# Patient Record
Sex: Female | Born: 1985 | Race: White | Hispanic: No | Marital: Single | Smoking: Never smoker
Health system: Southern US, Community
[De-identification: ages and names within clinical notes are randomized; demographics above are authoritative.]

## PROBLEM LIST (undated history)

## (undated) ENCOUNTER — Inpatient Hospital Stay (HOSPITAL_COMMUNITY): Payer: Self-pay

## (undated) DIAGNOSIS — J45909 Unspecified asthma, uncomplicated: Secondary | ICD-10-CM

## (undated) DIAGNOSIS — F909 Attention-deficit hyperactivity disorder, unspecified type: Secondary | ICD-10-CM

## (undated) DIAGNOSIS — O24419 Gestational diabetes mellitus in pregnancy, unspecified control: Secondary | ICD-10-CM

## (undated) DIAGNOSIS — F32A Depression, unspecified: Secondary | ICD-10-CM

## (undated) DIAGNOSIS — F419 Anxiety disorder, unspecified: Secondary | ICD-10-CM

## (undated) DIAGNOSIS — G43909 Migraine, unspecified, not intractable, without status migrainosus: Secondary | ICD-10-CM

## (undated) HISTORY — PX: AXILLARY LYMPH NODE BIOPSY: SHX5737

## (undated) HISTORY — DX: Anxiety disorder, unspecified: F41.9

## (undated) HISTORY — PX: KNEE SURGERY: SHX244

## (undated) HISTORY — PX: TONSILLECTOMY: SUR1361

---

## 2014-03-19 DIAGNOSIS — L709 Acne, unspecified: Secondary | ICD-10-CM | POA: Insufficient documentation

## 2014-03-19 DIAGNOSIS — L700 Acne vulgaris: Secondary | ICD-10-CM | POA: Insufficient documentation

## 2014-06-17 DIAGNOSIS — F32A Depression, unspecified: Secondary | ICD-10-CM | POA: Insufficient documentation

## 2014-06-17 DIAGNOSIS — F329 Major depressive disorder, single episode, unspecified: Secondary | ICD-10-CM | POA: Insufficient documentation

## 2014-08-10 ENCOUNTER — Ambulatory Visit: Payer: Self-pay | Admitting: Physician Assistant

## 2014-09-14 ENCOUNTER — Emergency Department: Payer: Self-pay | Admitting: Emergency Medicine

## 2014-10-06 DIAGNOSIS — R42 Dizziness and giddiness: Secondary | ICD-10-CM | POA: Insufficient documentation

## 2014-10-06 DIAGNOSIS — Z87898 Personal history of other specified conditions: Secondary | ICD-10-CM | POA: Insufficient documentation

## 2014-12-08 ENCOUNTER — Ambulatory Visit: Payer: Self-pay | Admitting: Emergency Medicine

## 2015-02-03 ENCOUNTER — Ambulatory Visit: Payer: Self-pay | Admitting: Family Medicine

## 2015-03-03 DIAGNOSIS — G43909 Migraine, unspecified, not intractable, without status migrainosus: Secondary | ICD-10-CM | POA: Insufficient documentation

## 2015-03-03 DIAGNOSIS — F909 Attention-deficit hyperactivity disorder, unspecified type: Secondary | ICD-10-CM | POA: Insufficient documentation

## 2015-04-07 DIAGNOSIS — J309 Allergic rhinitis, unspecified: Secondary | ICD-10-CM | POA: Insufficient documentation

## 2015-12-07 ENCOUNTER — Ambulatory Visit
Admission: RE | Admit: 2015-12-07 | Discharge: 2015-12-07 | Disposition: A | Discharge status: Home / Self Care | Payer: No Typology Code available for payment source | Source: Ambulatory Visit | Attending: Obstetrics and Gynecology | Admitting: Obstetrics and Gynecology

## 2015-12-07 ENCOUNTER — Ambulatory Visit: Payer: Self-pay

## 2015-12-07 ENCOUNTER — Other Ambulatory Visit: Payer: Self-pay | Admitting: Obstetrics and Gynecology

## 2015-12-07 DIAGNOSIS — O209 Hemorrhage in early pregnancy, unspecified: Secondary | ICD-10-CM

## 2015-12-07 DIAGNOSIS — O4691 Antepartum hemorrhage, unspecified, first trimester: Secondary | ICD-10-CM | POA: Diagnosis not present

## 2016-04-21 ENCOUNTER — Encounter: Payer: Self-pay | Admitting: *Deleted

## 2016-04-21 ENCOUNTER — Ambulatory Visit
Admission: EM | Admit: 2016-04-21 | Discharge: 2016-04-21 | Disposition: A | Discharge status: Home / Self Care | Payer: Worker's Compensation | Attending: Family Medicine | Admitting: Family Medicine

## 2016-04-21 DIAGNOSIS — W5501XA Bitten by cat, initial encounter: Secondary | ICD-10-CM | POA: Diagnosis not present

## 2016-04-21 DIAGNOSIS — S61052A Open bite of left thumb without damage to nail, initial encounter: Secondary | ICD-10-CM

## 2016-04-21 HISTORY — DX: Migraine, unspecified, not intractable, without status migrainosus: G43.909

## 2016-04-21 MED ORDER — AMOXICILLIN-POT CLAVULANATE 875-125 MG PO TABS: 1.0000 | ORAL_TABLET | Freq: Two times a day (BID) | ORAL | Status: DC

## 2016-04-21 MED ORDER — MUPIROCIN 2 % EX OINT: 1.0000 "application " | TOPICAL_OINTMENT | Freq: Three times a day (TID) | CUTANEOUS | Status: DC

## 2016-04-21 NOTE — ED Notes (Signed)
Patient was at work today when she was bit by a cat. The patient was giving the cat a pill when the cat bit her finger. The cat is known and does have it's rabies vaccination.

## 2016-04-21 NOTE — ED Provider Notes (Signed)
CSN: [id number]     Arrival date & time 04/21/16  1401 History   First MD Initiated Contact with Patient 04/21/16 1510   Nurses notes were reviewed. Chief Complaint  Patient presents with  . Animal Bite   Patient reports while working at her job she was trying to put a pill in the cat's mouth and the cat each with that and nicked her on her thumb. Since that she's has some pain and throbbing.  Patient does not have any drug allergies history of migraines. She's had tonsillectomy. She never smoked and is no significant past family medical history as this problem. She does report though several years ago having a with a feels dog bite right hand it went undiagnosed and almost basically sounds systemic reaction and she wound up on prednisone the swelling and other vague myalgias and pain. She does state though that she she was finally better after that and she didn't want having a lymph node biopsy and they think that it was from a dog bite.    (Consider location/radiation/quality/duration/timing/severity/associated sxs/prior Treatment) Patient is a 30 y.o. female presenting with animal bite. The history is provided by the patient. No language interpreter was used.  Animal Bite Contact animal:  Cat Location:  Finger Finger injury location:  L thumb Time since incident:  6 hours Pain details:    Quality:  Aching, sharp and stinging   Timing:  Constant   Progression:  Unchanged Incident location:  Work Provoked: provoked   Notifications:  Animal control Animal's rabies vaccination status:  Up to date Tetanus status:  Up to date Worsened by:  Nothing tried Ineffective treatments:  None tried Associated symptoms: no rash and no swelling     Past Medical History  Diagnosis Date  . Migraines    Past Surgical History  Procedure Laterality Date  . Tonsillectomy    . Knee surgery Right     repaired   History reviewed. No pertinent family history. Social History  Substance Use Topics   . Smoking status: Never Smoker   . Smokeless tobacco: Never Used  . Alcohol Use: Yes   OB History    No data available     Review of Systems  Skin: Negative for rash.  All other systems reviewed and are negative.   Allergies  Hydrocodone  Home Medications   Prior to Admission medications   Medication Sig Start Date End Date Taking? Authorizing Provider  hydrOXYzine (VISTARIL) 25 MG capsule Take 25 mg by mouth 3 (three) times daily as needed.   Yes Historical Provider, MD  venlafaxine (EFFEXOR) 75 MG tablet Take 75 mg by mouth 2 (two) times daily.   Yes Historical Provider, MD  amoxicillin-clavulanate (AUGMENTIN) 875-125 MG tablet Take 1 tablet by mouth 2 (two) times daily. 04/21/16   Hassan RowanEugene Natesha Hassey, MD  mupirocin ointment (BACTROBAN) 2 % Apply 1 application topically 3 (three) times daily. 04/21/16   Hassan RowanEugene Emmett Arntz, MD   Meds Ordered and Administered this Visit  Medications - No data to display  BP 127/79 mmHg  Pulse 89  Temp(Src) 89 F (31.7 C) (Oral)  Resp 87  Ht 5\' 3"  (1.6 m)  Wt 155 lb (70.308 kg)  BMI 27.46 kg/m2  SpO2 100%  LMP 04/07/2016 No data found.   Physical Exam  Constitutional: She is oriented to person, place, and time. She appears well-developed and well-nourished.  HENT:  Head: Normocephalic and atraumatic.  Eyes: Pupils are equal, round, and reactive to light.  Musculoskeletal: Normal  range of motion. She exhibits tenderness.       Left hand: She exhibits tenderness. Normal strength noted.       Hands: Puncture bite on the L thumb  Neurological: She is alert and oriented to person, place, and time.  Skin: Skin is warm and dry.  Psychiatric: She has a normal mood and affect.  Vitals reviewed.   ED Course  Procedures (including critical care time)  Labs Review Labs Reviewed - No data to display  Imaging Review No results found.   Visual Acuity Review  Right Eye Distance:   Left Eye Distance:   Bilateral Distance:    Right Eye Near:     Left Eye Near:    Bilateral Near:         MDM   1. Cat bite of thumb, left, initial encounter    Patient will be allowed to go back to work tomorrow. Recommend she bandaged and dressed the wound it is from the exposed to dirt." Control was notified by the staff. Sprain to her this is one the rare workers comp visits this required a follow-up unless she starts developing problems and that she should come back. Otherwise take the Augmentin tablets twice day for 7 days and Bactroban for 10 days. Workers comp form filled out as well.  Note: This dictation was prepared with Dragon dictation along with smaller phrase technology. Any transcriptional errors that result from this process are unintentional.    Hassan Rowan, MD 04/21/16 2024

## 2016-04-21 NOTE — Discharge Instructions (Signed)
Animal Bite °Animal bite wounds can get infected. It is important to get proper medical treatment. Ask your doctor if you need rabies treatment. °HOME CARE  °Wound Care °· Follow instructions from your doctor about how to take care of your wound. Make sure you: °¨ Wash your hands with soap and water before you change your bandage (dressing). If you cannot use soap and water, use hand sanitizer. °¨ Change your bandage as told by your doctor. °¨ Leave stitches (sutures), skin glue, or skin tape (adhesive) strips in place. They may need to stay in place for 2 weeks or longer. If tape strips get loose and curl up, you may trim the loose edges. Do not remove tape strips completely unless your doctor says it is okay. °· Check your wound every day for signs of infection. Watch for:   °¨   Redness, swelling, or pain that gets worse.   °¨   Fluid, blood, or pus.   °General Instructions °· Take or apply over-the-counter and prescription medicines only as told by your doctor.   °· If you were prescribed an antibiotic, take or apply it as told by your doctor. Do not stop using the antibiotic even if your condition improves.   °· Keep the injured area raised (elevated) above the level of your heart while you are sitting or lying down. °· If directed, apply ice to the injured area.   °¨   Put ice in a plastic bag.   °¨   Place a towel between your skin and the bag.   °¨   Leave the ice on for 20 minutes, 2-3 times per day.   °· Keep all follow-up visits as told by your doctor. This is important.   °GET HELP IF: °· You have redness, swelling, or pain that gets worse.   °· You have a general feeling of sickness (malaise).   °· You feel sick to your stomach (nauseous). °· You throw up (vomit).   °· You have pain that does not get better.   °GET HELP RIGHT AWAY IF:  °· You have a red streak going away from your wound.   °· You have fluid, blood, or pus coming from your wound.   °· You have a fever or chills.   °· You have trouble  moving your injured area.   °· You have numbness or tingling anywhere on your body.   °  °This information is not intended to replace advice given to you by your health care provider. Make sure you discuss any questions you have with your health care provider. °  °Document Released: 11/27/2005 Document Revised: 08/18/2015 Document Reviewed: 04/14/2015 °Elsevier Interactive Patient Education ©2016 [employer] ° °

## 2016-04-21 NOTE — ED Notes (Signed)
Officer Darcella Gasmanodson from [employer] came to [facility].

## 2016-07-18 ENCOUNTER — Telehealth: Payer: Self-pay | Admitting: Obstetrics and Gynecology

## 2016-07-18 NOTE — Telephone Encounter (Signed)
PT CALLED AND LEFT A MESSAGE ON THE OFFICE VM STATING THAT HER PALMS AND FEET ARE ITCHING REALLY BAD TO A POINT THAT SHE CAN NOT SLEEP AT NIGHT, SHE STATED SHE THINKS SHE HAS COLISTASIS AND WOULD LIKE TO COME IN AND GET BLOOD WORK DONE TO CHECK HER LIVER FUNCTION AND BOWELS. SHE STATED YOU CAN ALOS CALL HER BACK AT WORK SINCE HER CELL PHONE DOES NOT HAVE GOOD RECEPTION AT [phone], PT DOES WORK AT A [employer].

## 2016-07-18 NOTE — Telephone Encounter (Signed)
Called pt she states that she is having severe itching on her hands and feet (palms and soles) pt states that she has tried ice packs and benadryl for relief but has been unsuccessful. Denies exposure to new irritants.  Advised pt of the need to be seen. Pt scheduled for 07/20/16 @4 :30pm.

## 2016-07-20 ENCOUNTER — Encounter: Payer: Self-pay | Admitting: Obstetrics and Gynecology

## 2016-07-20 ENCOUNTER — Ambulatory Visit (INDEPENDENT_AMBULATORY_CARE_PROVIDER_SITE_OTHER): Payer: Managed Care, Other (non HMO) | Admitting: Obstetrics and Gynecology

## 2016-07-20 VITALS — BP 133/78 | HR 98 | Wt 169.4 lb

## 2016-07-20 DIAGNOSIS — Z3490 Encounter for supervision of normal pregnancy, unspecified, unspecified trimester: Secondary | ICD-10-CM

## 2016-07-20 DIAGNOSIS — L299 Pruritus, unspecified: Secondary | ICD-10-CM | POA: Diagnosis not present

## 2016-07-20 DIAGNOSIS — Z3401 Encounter for supervision of normal first pregnancy, first trimester: Secondary | ICD-10-CM | POA: Diagnosis not present

## 2016-07-21 NOTE — Progress Notes (Signed)
    GYNECOLOGY CLINIC PROGRESS NOTE  Subjective:    Patient ID: Brooke [name], female    DOB: 05/05/1986, 30 y.o.   MRN: [id number]  HPI  Brooke [name] is a 30 y.o. G1P0 female at 1462w2d ([facility] who presents for complaints of itching palms feet, and legs.  This has been ongoing for approximately 3-4 days.  States that she has been researching on the Internet, and was concerned about cholestasis of pregnancy. Wanted to have lab work done today.  Itching is not associated with a rash.  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  She also notes that she has recently discontinued her Effexor last week (was supposed to be transitioning from Effexor to Zoloft due to inadequate response to anxiety symptoms, however patient decided to stop medication cold [place] upon discovery of pregnancy).  The following portions of the patient's history were reviewed and updated as appropriate:   She  has a past medical history of Migraines.   She  has a past surgical history that includes Tonsillectomy and Knee surgery (Right).   Her family history is not on file.   She  reports that she has never smoked. She has never used smokeless tobacco. She reports that she drinks alcohol. She reports that she does not use drugs.   She has a current medication list which includes the following prescription(s): milk thistle and multivitamin-prenatal. No current outpatient prescriptions on file prior to visit.   No current facility-administered medications on file prior to visit.    She is allergic to hydrocodone..  Review of Systems Pertinent items noted in HPI and remainder of comprehensive ROS otherwise negative.   Objective:   Blood pressure 133/78, pulse 98, weight 169 lb 6.4 oz (76.8 kg), last menstrual period 04/07/2016. General appearance: alert and no distress, appears anxious Skin: Skin color, texture,  turgor normal. No rashes or lesions  Assessment:   Generalized itching Pregnancy  Plan:   - Discussion had with patient regarding origin of itching. Reassured patient that cholestasis of pregnancy was not likely due to early gestation. Also does not appear to be related to any particular pregnancy associated rashes like PUPPS. On further discussion with patient, it appears that her itching does increase during stressful situations. Patient likely experiencing anxiety induced itching, especially since she has discontinued her anxiety medications upon discovery of pregnancy.  Patient notes that she does not desire to resume antidepressant medications for treatment of anxiety symptoms. We'll prescribe Atarax for itching symptoms, patient notes that she was previously on this as well for treatment of her anxiety, however discontinued due to receiving mixed messages as to whether she could continue the medication pregnancy. Advised the patient can continue, will resume taking 25 mg daily. - Patient with early gestation, will be seen next week for her new OB intake. She will be tested for toxoplasmosis along with other routine OB labs at that time. Can also assess her itching response to the Atarax at that visit.   A total of 30 minutes were spent face-to-face with the patient during the encounter with greater than 50% dealing with counseling and coordination of care.   Hildred LaserAnika Delisia Mcquiston, MD [facility]

## 2016-07-26 ENCOUNTER — Ambulatory Visit (INDEPENDENT_AMBULATORY_CARE_PROVIDER_SITE_OTHER): Payer: Managed Care, Other (non HMO) | Admitting: Obstetrics and Gynecology

## 2016-07-26 ENCOUNTER — Ambulatory Visit (INDEPENDENT_AMBULATORY_CARE_PROVIDER_SITE_OTHER): Payer: Managed Care, Other (non HMO)

## 2016-07-26 VITALS — BP 127/92 | HR 83 | Wt 170.6 lb

## 2016-07-26 DIAGNOSIS — T7589XA Other specified effects of external causes, initial encounter: Secondary | ICD-10-CM

## 2016-07-26 DIAGNOSIS — Z331 Pregnant state, incidental: Secondary | ICD-10-CM | POA: Diagnosis not present

## 2016-07-26 DIAGNOSIS — Z3490 Encounter for supervision of normal pregnancy, unspecified, unspecified trimester: Secondary | ICD-10-CM

## 2016-07-26 DIAGNOSIS — Z113 Encounter for screening for infections with a predominantly sexual mode of transmission: Secondary | ICD-10-CM

## 2016-07-26 DIAGNOSIS — Z36 Encounter for antenatal screening of mother: Secondary | ICD-10-CM

## 2016-07-26 DIAGNOSIS — Z8742 Personal history of other diseases of the female genital tract: Secondary | ICD-10-CM

## 2016-07-26 DIAGNOSIS — F909 Attention-deficit hyperactivity disorder, unspecified type: Secondary | ICD-10-CM | POA: Insufficient documentation

## 2016-07-26 DIAGNOSIS — Z3481 Encounter for supervision of other normal pregnancy, first trimester: Secondary | ICD-10-CM

## 2016-07-26 DIAGNOSIS — Z369 Encounter for antenatal screening, unspecified: Secondary | ICD-10-CM

## 2016-07-26 DIAGNOSIS — Z1389 Encounter for screening for other disorder: Secondary | ICD-10-CM

## 2016-07-26 DIAGNOSIS — Z8759 Personal history of other complications of pregnancy, childbirth and the puerperium: Secondary | ICD-10-CM

## 2016-07-26 DIAGNOSIS — F419 Anxiety disorder, unspecified: Secondary | ICD-10-CM | POA: Insufficient documentation

## 2016-07-26 NOTE — Progress Notes (Signed)
Brooke [name] presents for NOB nurse interview visit. G-2.  P-0010. [facility] Pregnancy education material explained and given. 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 NOB labs ordered.  HIV labs and Drug screen were explained optional and she could opt out of tests but did not decline. Drug screen ordered. PNV encouraged. NT to discuss with provider. Pt. To follow up with provider after accurate determination of dates per ultrasound for NOB physical.  Currently will schedule 3 wks out. Also informed that husband side of family has twins. Recently had CBC, and ABO, RH and ATB screen done. All questions answered. Ultrasound showed EGA 5.6 wks. And will need a repeat in 2 weeks for ultrasound to check heart rate. EDD changed to 03/22/16. Will send pt a my chart message to contact office to schedule.

## 2016-07-26 NOTE — Patient Instructions (Signed)
Pregnancy and Zika Virus Disease Zika virus disease, or Zika, is an illness that can spread to people from mosquitoes that carry the virus. It may also spread from person to person through infected body fluids. Zika first occurred in [place], but recently it has spread to new areas. The virus occurs in tropical climates. The location of Zika continues to change. Most people who become infected with Zika virus do not develop serious illness. However, Zika may cause birth defects in an unborn baby whose mother is infected with the virus. It may also increase the risk of miscarriage. WHAT ARE THE SYMPTOMS OF ZIKA VIRUS DISEASE? In many cases, people who have been infected with Zika virus do not develop any symptoms. If symptoms appear, they usually start about a week after the person is infected. Symptoms are usually mild. They may include:  Fever.  Rash.  Red eyes.  Joint pain. HOW DOES ZIKA VIRUS DISEASE SPREAD? The main way that Zika virus spreads is through the bite of a certain type of mosquito. Unlike most types of mosquitos, which bite only at night, the type of mosquito that carries Zika virus bites both at night and during the day. Zika virus can also spread through sexual contact, through a blood transfusion, and from a mother to her baby before or during birth. Once you have had Zika virus disease, it is unlikely that you will get it again. CAN I PASS ZIKA TO MY BABY DURING PREGNANCY? Yes, Zika can pass from a mother to her baby before or during birth. WHAT PROBLEMS CAN ZIKA CAUSE FOR MY BABY? A woman who is infected with Zika virus while pregnant is at risk of having her baby born with a condition in which the brain or head is smaller than expected (microcephaly). Babies who have microcephaly can have developmental delays, seizures, hearing problems, and vision problems. Having Zika virus disease during pregnancy can also increase the risk of miscarriage. HOW CAN ZIKA VIRUS DISEASE BE  PREVENTED? There is no vaccine to prevent Zika. The best way to prevent the disease is to avoid infected mosquitoes and avoid exposure to body fluids that can spread the virus. Avoid any possible exposure to Zika by taking the following precautions. For women and their sex partners:  Avoid traveling to high-risk areas. The locations where Zika is being reported change often. To identify high-risk areas, check the CDC travel website: www.cdc.gov/zika/geo/index.html  If you or your sex partner must travel to a high-risk area, talk with a health care provider before and after traveling.  Take all precautions to avoid mosquito bites if you live in, or travel to, any of the high-risk areas. Insect repellents are safe to use during pregnancy.  Ask your health care provider when it is safe to have sexual contact. For women:  If you are pregnant or trying to become pregnant, avoid sexual contact with persons who may have been exposed to Zika virus, persons who have possible symptoms of Zika, or persons whose history you are unsure about. If you choose to have sexual contact with someone who may have been exposed to Zika virus, use condoms correctly during the entire duration of sexual activity, every time. Do not share sexual devices, as you may be exposed to body fluids.  Ask your health care provider about when it is safe to attempt pregnancy after a possible exposure to Zika virus. WHAT STEPS SHOULD I TAKE TO AVOID MOSQUITO BITES? Take these steps to avoid mosquito bites when you are   in a high-risk area:  Wear loose clothing that covers your arms and legs.  Limit your outdoor activities.  Do not open windows unless they have window screens.  Sleep under mosquito nets.  Use insect repellent. The best insect repellents have:  DEET, picaridin, oil of lemon eucalyptus (OLE), or [id number] in them.  Higher amounts of an active ingredient in them.  Remember that insect repellents are safe to use  during pregnancy.  Do not use OLE on children who are younger than 3 years of age. Do not use insect repellent on babies who are younger than 2 months of age.  Cover your child's stroller with mosquito netting. Make sure the netting fits snugly and that any loose netting does not cover your child's mouth or nose. Do not use a blanket as a mosquito-protection cover.  Do not apply insect repellent underneath clothing.  If you are using sunscreen, apply the sunscreen before applying the insect repellent.  Treat clothing with permethrin. Do not apply permethrin directly to your skin. Follow label directions for safe use.  Get rid of standing water, where mosquitoes may reproduce. Standing water is often found in items such as buckets, bowls, animal food dishes, and flowerpots. When you return from traveling to any high-risk area, continue taking actions to protect yourself against mosquito bites for 3 weeks, even if you show no signs of illness. This will prevent spreading Zika virus to uninfected mosquitoes. WHAT SHOULD I KNOW ABOUT THE SEXUAL TRANSMISSION OF ZIKA? People can spread Zika to their sexual partners during vaginal, anal, or oral sex, or by sharing sexual devices. Many people with Zika do not develop symptoms, so a person could spread the disease without knowing that they are infected. The greatest risk is to women who are pregnant or who may become pregnant. Zika virus can live longer in semen than it can live in blood. Couples can prevent sexual transmission of the virus by:  Using condoms correctly during the entire duration of sexual activity, every time. This includes vaginal, anal, and oral sex.  Not sharing sexual devices. Sharing increases your risk of being exposed to body fluid from another person.  Avoiding all sexual activity until your health care provider says it is safe. SHOULD I BE TESTED FOR ZIKA VIRUS? A sample of your blood can be tested for Zika virus. A pregnant  woman should be tested if she may have been exposed to the virus or if she has symptoms of Zika. She may also have additional tests done during her pregnancy, such ultrasound testing. Talk with your health care provider about which tests are recommended.   This information is not intended to replace advice given to you by your health care provider. Make sure you discuss any questions you have with your health care provider.   Document Released: 08/18/2015 Document Reviewed: 08/11/2015 Elsevier Interactive Patient Education 2016 [employer] Minor Illnesses and Medications in Pregnancy  Cold/Flu:  Sudafed for congestion- Robitussin (plain) for cough- Tylenol for discomfort.  Please follow the directions on the label.  Try not to take any more than needed.  OTC Saline nasal spray and air humidifier or cool-mist  Vaporizer to sooth nasal irritation and to loosen congestion.  It is also important to increase intake of non carbonated fluids, especially if you have a fever.  Constipation:  Colace-2 capsules at bedtime; Metamucil- follow directions on label; Senokot- 1 tablet at bedtime.  Any one of these medications can be used.  It is also   very important to increase fluids and fruits along with regular exercise.  If problem persists please call the office.  Diarrhea:  Kaopectate as directed on the label.  Eat a bland diet and increase fluids.  Avoid highly seasoned foods.  Headache:  Tylenol 1 or 2 tablets every 3-4 hours as needed  Indigestion:  Maalox, Mylanta, Tums or Rolaids- as directed on label.  Also try to eat small meals and avoid fatty, greasy or spicy foods.  Nausea with or without Vomiting:  Nausea in pregnancy is caused by increased levels of hormones in the body which influence the digestive system and cause irritation when stomach acids accumulate.  Symptoms usually subside after 1st trimester of pregnancy.  Try the following: 1. Keep saltines, graham crackers or dry toast by your bed  to eat upon awakening. 2. Don't let your stomach get empty.  Try to eat 5-6 small meals per day instead of 3 large ones. 3. Avoid greasy fatty or highly seasoned foods.  4. Take OTC Unisom 1 tablet at bed time along with OTC Vitamin B6 25-50 mg 3 times per day.    If nausea continues with vomiting and you are unable to keep down food and fluids you may need a prescription medication.  Please notify your provider.   Sore throat:  Chloraseptic spray, throat lozenges and or plain Tylenol.  Vaginal Yeast Infection:  OTC Monistat for 7 days as directed on label.  If symptoms do not resolve within a week notify provider.  If any of the above problems do not subside with recommended treatment please call the office for further assistance.   Do not take Aspirin, Advil, Motrin or Ibuprofen.  * * OTC= Over the counter Hyperemesis Gravidarum Hyperemesis gravidarum is a severe form of nausea and vomiting that happens during pregnancy. Hyperemesis is worse than morning sickness. It may cause you to have nausea or vomiting all day for many days. It may keep you from eating and drinking enough food and liquids. Hyperemesis usually occurs during the first half (the first 20 weeks) of pregnancy. It often goes away once a woman is in her second half of pregnancy. However, sometimes hyperemesis continues through an entire pregnancy.  CAUSES  The cause of this condition is not completely known but is thought to be related to changes in the body's hormones when pregnant. It could be from the high level of the pregnancy hormone or an increase in estrogen in the body.  SIGNS AND SYMPTOMS   Severe nausea and vomiting.  Nausea that does not go away.  Vomiting that does not allow you to keep any food down.  Weight loss and body fluid loss (dehydration).  Having no desire to eat or not liking food you have previously enjoyed. DIAGNOSIS  Your health care provider will do a physical exam and ask you about your  symptoms. He or she may also order blood tests and urine tests to make sure something else is not causing the problem.  TREATMENT  You may only need medicine to control the problem. If medicines do not control the nausea and vomiting, you will be treated in the hospital to prevent dehydration, increased acid in the blood (acidosis), weight loss, and changes in the electrolytes in your body that may harm the unborn baby (fetus). You may need IV fluids.  HOME CARE INSTRUCTIONS   Only take over-the-counter or prescription medicines as directed by your health care provider.  Try eating a couple of dry crackers or  toast in the morning before getting out of bed.  Avoid foods and smells that upset your stomach.  Avoid fatty and spicy foods.  Eat 5-6 small meals a day.  Do not drink when eating meals. Drink between meals.  For snacks, eat high-protein foods, such as cheese.  Eat or suck on things that have ginger in them. Ginger helps nausea.  Avoid food preparation. The smell of food can spoil your appetite.  Avoid iron pills and iron in your multivitamins until after 3-4 months of being pregnant. However, consult with your health care provider before stopping any prescribed iron pills. SEEK MEDICAL CARE IF:   Your abdominal pain increases.  You have a severe headache.  You have vision problems.  You are losing weight. SEEK IMMEDIATE MEDICAL CARE IF:   You are unable to keep fluids down.  You vomit blood.  You have constant nausea and vomiting.  You have excessive weakness.  You have extreme thirst.  You have dizziness or fainting.  You have a fever or persistent symptoms for more than 2-3 days.  You have a fever and your symptoms suddenly get worse. MAKE SURE YOU:   Understand these instructions.  Will watch your condition.  Will get help right away if you are not doing well or get worse.   This information is not intended to replace advice given to you by your  health care provider. Make sure you discuss any questions you have with your health care provider.   Document Released: 11/27/2005 Document Revised: 09/17/2013 Document Reviewed: 07/09/2013 Elsevier Interactive Patient Education 2016 [employer] Inc. Hyperemesis Gravidarum Hyperemesis gravidarum is a severe form of nausea and vomiting that happens during pregnancy. Hyperemesis is worse than morning sickness. It may cause you to have nausea or vomiting all day for many days. It may keep you from eating and drinking enough food and liquids. Hyperemesis usually occurs during the first half (the first 20 weeks) of pregnancy. It often goes away once a woman is in her second half of pregnancy. However, sometimes hyperemesis continues through an entire pregnancy.  CAUSES  The cause of this condition is not completely known but is thought to be related to changes in the body's hormones when pregnant. It could be from the high level of the pregnancy hormone or an increase in estrogen in the body.  SIGNS AND SYMPTOMS   Severe nausea and vomiting.  Nausea that does not go away.  Vomiting that does not allow you to keep any food down.  Weight loss and body fluid loss (dehydration).  Having no desire to eat or not liking food you have previously enjoyed. DIAGNOSIS  Your health care provider will do a physical exam and ask you about your symptoms. He or she may also order blood tests and urine tests to make sure something else is not causing the problem.  TREATMENT  You may only need medicine to control the problem. If medicines do not control the nausea and vomiting, you will be treated in the hospital to prevent dehydration, increased acid in the blood (acidosis), weight loss, and changes in the electrolytes in your body that may harm the unborn baby (fetus). You may need IV fluids.  HOME CARE INSTRUCTIONS   Only take over-the-counter or prescription medicines as directed by your health care  provider.  Try eating a couple of dry crackers or toast in the morning before getting out of bed.  Avoid foods and smells that upset your stomach.  Avoid fatty  and spicy foods.  Eat 5-6 small meals a day.  Do not drink when eating meals. Drink between meals.  For snacks, eat high-protein foods, such as cheese.  Eat or suck on things that have ginger in them. Ginger helps nausea.  Avoid food preparation. The smell of food can spoil your appetite.  Avoid iron pills and iron in your multivitamins until after 3-4 months of being pregnant. However, consult with your health care provider before stopping any prescribed iron pills. SEEK MEDICAL CARE IF:   Your abdominal pain increases.  You have a severe headache.  You have vision problems.  You are losing weight. SEEK IMMEDIATE MEDICAL CARE IF:   You are unable to keep fluids down.  You vomit blood.  You have constant nausea and vomiting.  You have excessive weakness.  You have extreme thirst.  You have dizziness or fainting.  You have a fever or persistent symptoms for more than 2-3 days.  You have a fever and your symptoms suddenly get worse. MAKE SURE YOU:   Understand these instructions.  Will watch your condition.  Will get help right away if you are not doing well or get worse.   This information is not intended to replace advice given to you by your health care provider. Make sure you discuss any questions you have with your health care provider.   Document Released: 11/27/2005 Document Revised: 09/17/2013 Document Reviewed: 07/09/2013 Elsevier Interactive Patient Education 2016 [employer]. Commonly Asked Questions During Pregnancy  Cats: A parasite can be excreted in cat feces.  To avoid exposure you need to have another person empty the little box.  If you must empty the litter box you will need to wear gloves.  Wash your hands after handling your cat.  This parasite can also be found in raw or  undercooked meat so this should also be avoided.  Colds, Sore Throats, Flu: Please check your medication sheet to see what you can take for symptoms.  If your symptoms are unrelieved by these medications please call the office.  Dental Work: Most any dental work Agricultural consultant recommends is permitted.  X-rays should only be taken during the first trimester if absolutely necessary.  Your abdomen should be shielded with a lead apron during all x-rays.  Please notify your provider prior to receiving any x-rays.  Novocaine is fine; gas is not recommended.  If your dentist requires a note from [place] prior to dental work please call the office and we will provide one for you.  Exercise: Exercise is an important part of staying healthy during your pregnancy.  You may continue most exercises you were accustomed to prior to pregnancy.  Later in your pregnancy you will most likely notice you have difficulty with activities requiring balance like riding a bicycle.  It is important that you listen to your body and avoid activities that put you at a higher risk of falling.  Adequate rest and staying well hydrated are a must!  If you have questions about the safety of specific activities ask your provider.    Exposure to Children with illness: Try to avoid obvious exposure; report any symptoms to [place] when noted,  If you have chicken pos, red measles or mumps, you should be immune to these diseases.   Please do not take any vaccines while pregnant unless you have checked with your OB provider.  Fetal Movement: After 28 weeks we recommend you do "kick counts" twice daily.  Lie or sit down  in a calm quiet environment and count your baby movements "kicks".  You should feel your baby at least 10 times per hour.  If you have not felt 10 kicks within the first hour get up, walk around and have something sweet to eat or drink then repeat for an additional hour.  If count remains less than 10 per hour notify your  provider.  Fumigating: Follow your pest control agent's advice as to how long to stay out of your home.  Ventilate the area well before re-entering.  Hemorrhoids:   Most over-the-counter preparations can be used during pregnancy.  Check your medication to see what is safe to use.  It is important to use a stool softener or fiber in your diet and to drink lots of liquids.  If hemorrhoids seem to be getting worse please call the office.   Hot Tubs:  Hot tubs Jacuzzis and saunas are not recommended while pregnant.  These increase your internal body temperature and should be avoided.  Intercourse:  Sexual intercourse is safe during pregnancy as long as you are comfortable, unless otherwise advised by your provider.  Spotting may occur after intercourse; report any bright red bleeding that is heavier than spotting.  Labor:  If you know that you are in labor, please go to the hospital.  If you are unsure, please call the office and let us help you decide what to do.  Lifting, straining, etc:  If your job requires heavy lifting or straining please check with your provider for any limitations.  Generally, you should not lift items heavier than that you can lift simply with your hands and arms (no back muscles)  Painting:  Paint fumes do not harm your pregnancy, but may make you ill and should be avoided if possible.  Latex or water based paints have less odor than oils.  Use adequate ventilation while painting.  Permanents & Hair Color:  Chemicals in hair dyes are not recommended as they cause increase hair dryness which can increase hair loss during pregnancy.  " Highlighting" and permanents are allowed.  Dye may be absorbed differently and permanents may not hold as well during pregnancy.  Sunbathing:  Use a sunscreen, as skin burns easily during pregnancy.  Drink plenty of fluids; avoid over heating.  Tanning Beds:  Because their possible side effects are still unknown, tanning beds are not  recommended.  Ultrasound Scans:  Routine ultrasounds are performed at approximately 20 weeks.  You will be able to see your baby's general anatomy an if you would like to know the gender this can usually be determined as well.  If it is questionable when you conceived you may also receive an ultrasound early in your pregnancy for dating purposes.  Otherwise ultrasound exams are not routinely performed unless there is a medical necessity.  Although you can request a scan we ask that you pay for it when conducted because insurance does not cover " patient request" scans.  Work: If your pregnancy proceeds without complications you may work until your due date, unless your physician or employer advises otherwise.  Round Ligament Pain/Pelvic Discomfort:  Sharp, shooting pains not associated with bleeding are fairly common, usually occurring in the second trimester of pregnancy.  They tend to be worse when standing up or when you remain standing for long periods of time.  These are the result of pressure of certain pelvic ligaments called "round ligaments".  Rest, Tylenol and heat seem to be the most effective relief.  As the womb and fetus grow, they rise out of the pelvis and the discomfort improves.  Please notify the office if your pain seems different than that described.  It may represent a more serious condition.

## 2016-07-27 LAB — RPR: RPR: NONREACTIVE

## 2016-07-27 LAB — TOXOPLASMA ANTIBODIES- IGG AND  IGM
Toxoplasma Antibody- IgM: <3 [AU]/ml (ref 0.0–7.9)
Toxoplasma IgG Ratio: <3 [IU]/mL (ref 0.0–7.1)

## 2016-07-27 LAB — RUBELLA SCREEN: Rubella Antibodies, IGG: 3.13 {index}

## 2016-07-27 LAB — HEPATITIS B SURFACE ANTIGEN: Hepatitis B Surface Ag: NEGATIVE

## 2016-07-27 LAB — GC/CHLAMYDIA PROBE AMP
CHLAMYDIA, DNA PROBE: NEGATIVE
Neisseria gonorrhoeae by PCR: NEGATIVE

## 2016-07-27 LAB — HIV ANTIBODY (ROUTINE TESTING W REFLEX): HIV SCREEN 4TH GENERATION: NONREACTIVE

## 2016-07-27 LAB — VARICELLA ZOSTER ANTIBODY, IGG: VARICELLA: 3053 {index} (ref >165)

## 2016-07-28 LAB — MONITOR DRUG PROFILE 14(MW)
AMPHETAMINE SCREEN URINE: NEGATIVE ng/mL
BARBITURATE SCREEN URINE: NEGATIVE ng/mL
BENZODIAZEPINE SCREEN, URINE: NEGATIVE ng/mL
Buprenorphine, Urine: NEGATIVE ng/mL
CANNABINOIDS UR QL SCN: NEGATIVE ng/mL
COCAINE(METAB.)SCREEN, URINE: NEGATIVE ng/mL
Creatinine(Crt), U: 112.2 mg/dL (ref 20.0–300.0)
FENTANYL, URINE: NEGATIVE pg/mL
MEPERIDINE SCREEN, URINE: NEGATIVE ng/mL
Methadone Screen, Urine: NEGATIVE ng/mL
OPIATE SCREEN URINE: NEGATIVE ng/mL
OXYCODONE+OXYMORPHONE UR QL SCN: NEGATIVE ng/mL
PROPOXYPHENE SCREEN URINE: NEGATIVE ng/mL
Ph of Urine: 6.3 (ref 4.5–8.9)
Phencyclidine Qn, Ur: NEGATIVE ng/mL
SPECIFIC GRAVITY: 1.02
TRAMADOL SCREEN, URINE: NEGATIVE ng/mL

## 2016-07-28 LAB — URINALYSIS, ROUTINE W REFLEX MICROSCOPIC
Bilirubin, UA: NEGATIVE
GLUCOSE, UA: NEGATIVE
Ketones, UA: NEGATIVE
Leukocytes, UA: NEGATIVE
NITRITE UA: NEGATIVE
PH UA: 6.5 (ref 5.0–7.5)
Protein, UA: NEGATIVE
RBC, UA: NEGATIVE
Specific Gravity, UA: 1.023 (ref 1.005–1.030)
UUROB: 0.2 mg/dL (ref 0.2–1.0)

## 2016-07-28 LAB — URINE CULTURE, OB REFLEX

## 2016-07-28 LAB — NICOTINE SCREEN, URINE: Cotinine Ql Scrn, Ur: NEGATIVE ng/mL

## 2016-07-28 LAB — CULTURE, OB URINE

## 2016-08-02 ENCOUNTER — Encounter: Payer: Self-pay | Admitting: Obstetrics and Gynecology

## 2016-08-02 ENCOUNTER — Telehealth: Payer: Self-pay

## 2016-08-02 NOTE — Telephone Encounter (Signed)
Left message to contact office after reading her my chart message regarding her appts.

## 2016-08-07 NOTE — Telephone Encounter (Signed)
Pt now has NOB appt on 08/29/2016 per schedule as of 08/02/2016. I also called and left message for pt that she needs another appt for an ultrasound this week. She may either check her email or ask to speak with me.

## 2016-08-10 ENCOUNTER — Encounter: Payer: Self-pay | Admitting: *Deleted

## 2016-08-10 ENCOUNTER — Ambulatory Visit (INDEPENDENT_AMBULATORY_CARE_PROVIDER_SITE_OTHER): Payer: Managed Care, Other (non HMO) | Admitting: Obstetrics and Gynecology

## 2016-08-10 ENCOUNTER — Encounter
Admission: RE | Admit: 2016-08-10 | Discharge: 2016-08-10 | Disposition: A | Discharge status: Home / Self Care | Payer: Managed Care, Other (non HMO) | Source: Ambulatory Visit | Attending: Obstetrics and Gynecology | Admitting: Obstetrics and Gynecology

## 2016-08-10 ENCOUNTER — Encounter
Admission: RE | Disposition: A | Discharge status: Home / Self Care | Payer: Self-pay | Source: Ambulatory Visit | Attending: Obstetrics and Gynecology

## 2016-08-10 ENCOUNTER — Ambulatory Visit: Payer: Managed Care, Other (non HMO) | Admitting: Anesthesiology

## 2016-08-10 ENCOUNTER — Encounter: Payer: Self-pay | Admitting: Obstetrics and Gynecology

## 2016-08-10 ENCOUNTER — Ambulatory Visit (INDEPENDENT_AMBULATORY_CARE_PROVIDER_SITE_OTHER): Payer: Managed Care, Other (non HMO)

## 2016-08-10 ENCOUNTER — Ambulatory Visit
Admission: RE | Admit: 2016-08-10 | Discharge: 2016-08-10 | Disposition: A | Discharge status: Home / Self Care | Payer: Managed Care, Other (non HMO) | Source: Ambulatory Visit | Attending: Obstetrics and Gynecology | Admitting: Obstetrics and Gynecology

## 2016-08-10 VITALS — BP 110/74 | HR 85 | Ht 64.0 in | Wt 173.4 lb

## 2016-08-10 DIAGNOSIS — O021 Missed abortion: Secondary | ICD-10-CM | POA: Insufficient documentation

## 2016-08-10 DIAGNOSIS — Z01812 Encounter for preprocedural laboratory examination: Secondary | ICD-10-CM

## 2016-08-10 DIAGNOSIS — Z9889 Other specified postprocedural states: Secondary | ICD-10-CM

## 2016-08-10 DIAGNOSIS — Z36 Encounter for antenatal screening of mother: Secondary | ICD-10-CM | POA: Diagnosis not present

## 2016-08-10 DIAGNOSIS — Z369 Encounter for antenatal screening, unspecified: Secondary | ICD-10-CM

## 2016-08-10 DIAGNOSIS — Z3A09 9 weeks gestation of pregnancy: Secondary | ICD-10-CM | POA: Diagnosis not present

## 2016-08-10 DIAGNOSIS — Z8742 Personal history of other diseases of the female genital tract: Secondary | ICD-10-CM | POA: Diagnosis not present

## 2016-08-10 DIAGNOSIS — Z8759 Personal history of other complications of pregnancy, childbirth and the puerperium: Secondary | ICD-10-CM

## 2016-08-10 HISTORY — PX: DILATION AND EVACUATION: SHX1459

## 2016-08-10 LAB — CBC WITH DIFFERENTIAL/PLATELET
BASOS PCT: 0 %
Basophils Absolute: 0 10*3/uL (ref 0–0.1)
Eosinophils Absolute: 0 10*3/uL (ref 0–0.7)
Eosinophils Relative: 0 %
HEMATOCRIT: 37.4 % (ref 35.0–47.0)
HEMOGLOBIN: 13.4 g/dL (ref 12.0–16.0)
LYMPHS ABS: 2.2 10*3/uL (ref 1.0–3.6)
LYMPHS PCT: 27 %
MCH: 30.4 pg (ref 26.0–34.0)
MCHC: 35.8 g/dL (ref 32.0–36.0)
MCV: 84.7 fL (ref 80.0–100.0)
MONO ABS: 0.6 10*3/uL (ref 0.2–0.9)
MONOS PCT: 8 %
NEUTROS ABS: 5.3 10*3/uL (ref 1.4–6.5)
NEUTROS PCT: 65 %
Platelets: 350 10*3/uL (ref 150–440)
RBC: 4.42 MIL/uL (ref 3.80–5.20)
RDW: 13.5 % (ref 11.5–14.5)
WBC: 8.1 10*3/uL (ref 3.6–11.0)

## 2016-08-10 LAB — TYPE AND SCREEN
ABO/RH(D): A POS
ANTIBODY SCREEN: NEGATIVE
Extend sample reason: UNDETERMINED

## 2016-08-10 SURGERY — DILATION AND EVACUATION, UTERUS
Anesthesia: General | Wound class: Clean Contaminated

## 2016-08-10 MED ORDER — KETOROLAC TROMETHAMINE 30 MG/ML IJ SOLN
INTRAMUSCULAR | Status: DC | PRN
  Administered 2016-08-10: 30 mg via INTRAVENOUS

## 2016-08-10 MED ORDER — OXYCODONE-ACETAMINOPHEN 5-325 MG PO TABS: 1.0000 | ORAL_TABLET | ORAL | 0 refills | Status: DC | PRN

## 2016-08-10 MED ORDER — LIDOCAINE HCL (CARDIAC) 20 MG/ML IV SOLN
INTRAVENOUS | Status: DC | PRN
  Administered 2016-08-10: 80 mg via INTRAVENOUS

## 2016-08-10 MED ORDER — ONDANSETRON HCL 4 MG/2ML IJ SOLN: 4.0000 mg | Freq: Once | INTRAMUSCULAR | Status: DC | PRN

## 2016-08-10 MED ORDER — ONDANSETRON HCL 4 MG/2ML IJ SOLN
INTRAMUSCULAR | Status: DC | PRN
  Administered 2016-08-10: 4 mg via INTRAVENOUS

## 2016-08-10 MED ORDER — FENTANYL CITRATE (PF) 100 MCG/2ML IJ SOLN
INTRAMUSCULAR | Status: AC
  Filled 2016-08-10: qty 2

## 2016-08-10 MED ORDER — SUCCINYLCHOLINE CHLORIDE 20 MG/ML IJ SOLN
INTRAMUSCULAR | Status: DC | PRN
  Administered 2016-08-10: 100 mg via INTRAVENOUS

## 2016-08-10 MED ORDER — FENTANYL CITRATE (PF) 100 MCG/2ML IJ SOLN
25.0000 ug | INTRAMUSCULAR | Status: DC | PRN
  Administered 2016-08-10: 25 ug via INTRAVENOUS

## 2016-08-10 MED ORDER — PROPOFOL 10 MG/ML IV BOLUS
INTRAVENOUS | Status: DC | PRN
  Administered 2016-08-10: 150 mg via INTRAVENOUS
  Administered 2016-08-10: 80 mg via INTRAVENOUS

## 2016-08-10 MED ORDER — LACTATED RINGERS IV SOLN
INTRAVENOUS | Status: DC
  Administered 2016-08-10: 18:00:00 via INTRAVENOUS

## 2016-08-10 MED ORDER — FENTANYL CITRATE (PF) 100 MCG/2ML IJ SOLN
INTRAMUSCULAR | Status: DC | PRN
  Administered 2016-08-10 (×3): 50 ug via INTRAVENOUS

## 2016-08-10 MED ORDER — IBUPROFEN 800 MG PO TABS
800.0000 mg | ORAL_TABLET | Freq: Three times a day (TID) | ORAL | 1 refills | Status: DC
Start: 2016-08-10 — End: 2024-03-18

## 2016-08-10 MED ORDER — MIDAZOLAM HCL 2 MG/2ML IJ SOLN
INTRAMUSCULAR | Status: DC | PRN
  Administered 2016-08-10: 2 mg via INTRAVENOUS

## 2016-08-10 MED ORDER — DEXAMETHASONE SODIUM PHOSPHATE 10 MG/ML IJ SOLN
INTRAMUSCULAR | Status: DC | PRN
  Administered 2016-08-10: 10 mg via INTRAVENOUS

## 2016-08-10 SURGICAL SUPPLY — 19 items
CATH ROBINSON RED A/P 16FR (CATHETERS) ×3 IMPLANT
DRSG TELFA 3X8 NADH (GAUZE/BANDAGES/DRESSINGS) ×3 IMPLANT
GLOVE BIO SURGEON STRL SZ8 (GLOVE) ×3 IMPLANT
GOWN STRL REUS W/ TWL LRG LVL3 (GOWN DISPOSABLE) ×1 IMPLANT
GOWN STRL REUS W/ TWL XL LVL3 (GOWN DISPOSABLE) ×1 IMPLANT
GOWN STRL REUS W/TWL LRG LVL3 (GOWN DISPOSABLE) ×2
GOWN STRL REUS W/TWL XL LVL3 (GOWN DISPOSABLE) ×2
KIT BERKELEY 1ST TRIMESTER 3/8 (MISCELLANEOUS) ×3 IMPLANT
KIT RM TURNOVER CYSTO AR (KITS) ×3 IMPLANT
PACK DNC HYST (MISCELLANEOUS) ×3 IMPLANT
PAD OB MATERNITY 4.3X12.25 (PERSONAL CARE ITEMS) ×3 IMPLANT
PAD PREP 24X41 OB/GYN DISP (PERSONAL CARE ITEMS) ×3 IMPLANT
SET BERKELEY SUCTION TUBING (SUCTIONS) ×3 IMPLANT
SOL PREP PVP 2OZ (MISCELLANEOUS) ×3
SOLUTION PREP PVP 2OZ (MISCELLANEOUS) ×1 IMPLANT
SPONGE XRAY 4X4 16PLY STRL (MISCELLANEOUS) ×3 IMPLANT
TOWEL OR 17X26 4PK STRL BLUE (TOWEL DISPOSABLE) ×3 IMPLANT
VACURETTE 10 RIGID CVD (CANNULA) ×3 IMPLANT
VACURETTE 8 RIGID CVD (CANNULA) IMPLANT

## 2016-08-10 NOTE — H&P (View-Only) (Signed)
GYN ENCOUNTER NOTE  Subjective:  Preoperative history and physical        Brooke [name] is a 30 y.o. G2P0010 female is here for gynecologic evaluation of the following issues:  1. Missed AB  LMP 06/07/2016 EGA 9.0 weeks EDD 03/14/2017  Ultrasound for viability today revealed an intrauterine fetal demise with crown-rump length measuring 9 mm without evidence of fetal cardiac activity previous ultrasound demonstrated fetal cardiac activity at 101 bpm 2 weeks ago. Patient is not experiencing any vaginal bleeding or pelvic cramping.  Last meal was at 11 AM today   ULTRASOUND REPORT  Location: [facility] Date of Service: 08/10/16  Indications:Follow up low heart rate on previous ultrasound Findings:  Singleton intrauterine pregnancy is visualized with a CRL consistent with 6 6/[redacted] weeks gestation, giving an (U/S) EDD of 03/30/17. The (U/S) EDD is NOTconsistent with the clinically established (Prior US) EDD of 03/22/17.  FHR: no fetal heart beat detected CRL measurement: 9 mm  Right Ovary measures 3 x 2.2 x 1.9 cm. It is normal in appearance. Left Ovary measures 2.5 x 1.5 x 1.9 cm. It is normal appearance. There is evidence of a corpus luteal cyst in the Left Survey of the adnexa demonstrates no adnexal masses. There is no free peritoneal fluid in the cul de sac.  Impression: 1. 6 6/7 week NON-Viable Singleton Intrauterine pregnancy by U/S. 2. (U/S) EDD is NOT consistent with Clinically established (Prior US) EDD of 03/22/17 .  Recommendations: 1.Clinical correlation with the patient's History and Physical Exam.  Maria E Hill  Nashawn Hillock A Marishka Rentfrow, MD    Gynecologic History Patient's last menstrual period was 06/07/2016. Contraception: none   Obstetric History OB History  Gravida Para Term Preterm AB Living  2       1    SAB TAB Ectopic Multiple Live Births  1            # Outcome Date GA Lbr Len/2nd Weight Sex Delivery Anes PTL Lv  2 Current            1 SAB 12/07/15        ND      Past Medical History:  Diagnosis Date  . Anxiety   . Migraines     Past Surgical History:  Procedure Laterality Date  . AXILLARY LYMPH NODE BIOPSY Right    benign  . KNEE SURGERY Right    repaired  . TONSILLECTOMY      Current Outpatient Prescriptions on File Prior to Visit  Medication Sig Dispense Refill  . hydrOXYzine (ATARAX/VISTARIL) 25 MG tablet Take 25 mg by mouth at bedtime as needed.    . Milk Thistle 1000 MG CAPS Take by mouth.    . Prenatal Vit-Fe Fumarate-FA (MULTIVITAMIN-PRENATAL) 27-0.8 MG TABS tablet Take 1 tablet by mouth daily at 12 noon.     No current facility-administered medications on file prior to visit.     Allergies  Allergen Reactions  . Hydrocodone Palpitations    Social History   Social History  . Marital status: Divorced    Spouse name: N/A  . Number of children: N/A  . Years of education: N/A   Occupational History  . Not on file.   Social History Main Topics  . Smoking status: Never Smoker  . Smokeless tobacco: Never Used  . Alcohol use Yes     Comment: before pregnancy  . Drug use: No  . Sexual activity: Yes    Partners: Male      Birth control/ protection: None     Comment: Pregnant   Other Topics Concern  . Not on file   Social History Narrative  . No narrative on file    Family History  Problem Relation Age of Onset  . Hypertension Mother   . Migraines Mother   . Diabetes Maternal Grandmother   . Osteoarthritis Maternal Grandmother   . Throat cancer Maternal Grandmother   . Cancer Maternal Grandmother   . Cancer Maternal Grandfather     The following portions of the patient's history were reviewed and updated as appropriate: allergies, current medications, past family history, past medical history, past social history, past surgical history and problem list.  Review of Systems Review of Systems -  No recent illness No history of vaginal bleeding No recent history of  fevers chills or sweats No history of pulmonary disease or reactive airway disease No history of bleeding disorder  Objective:   BP 110/74 (BP Location: Left Arm, Patient Position: Sitting, Cuff Size: Large)   Pulse 85   Ht 5' 4" (1.626 m)   Wt 173 lb 6.4 oz (78.7 kg)   LMP 06/07/2016   BMI 29.76 kg/m  CONSTITUTIONAL: Well-developed, well-nourished female in no acute distress.  HENT:  Normocephalic, atraumatic. Oropharynx clear NECK: Normal range of motion, supple, no masses.  Normal thyroid.  SKIN: Skin is warm and dry. No rash noted. Not diaphoretic. No erythema. No pallor. NEUROLGIC: Alert and oriented to person, place, and time. PSYCHIATRIC: Normal mood and affect. Normal behavior. Normal judgment and thought content. CARDIOVASCULAR:Regular rate and rhythm without murmur RESPIRATORY: Clear BREASTS: Not Examined ABDOMEN: Soft, non distended; Non tender.  No Organomegaly. PELVIC: Deferred to the OR MUSCULOSKELETAL: Normal range of motion. No tenderness.  No cyanosis, clubbing, or edema.     Assessment:   Missed AB-first trimester   Plan:   Suction D&C date of surgery 08/10/2016  Preoperative counseling: The patient is to undergo suction D&C for missed AB. She is understanding of the planned procedure and is aware of and is accepting of all surgical risks which include but are not limited to bleeding, infection, pelvic organ injury with need for repair, blood clot disorders, anesthesia risks, etc. All questions have been answered. Informed consent is given. Patient is ready and willing to proceed with surgery as scheduled.  Jaelynn Pozo A Khristy Kalan, MD  Note: This dictation was prepared with Dragon dictation along with smaller phrase technology. Any transcriptional errors that result from this process are unintentional.   

## 2016-08-10 NOTE — Anesthesia Procedure Notes (Signed)
Procedure Name: Intubation Performed by: Malva CoganBEANE, Naveyah Iacovelli Pre-anesthesia Checklist: Patient identified, Patient being monitored, Timeout performed, Emergency Drugs available and Suction available Patient Re-evaluated:Patient Re-evaluated prior to inductionOxygen Delivery Method: Circle system utilized Preoxygenation: Pre-oxygenation with 100% oxygen Intubation Type: IV induction, Rapid sequence and Cricoid Pressure applied Laryngoscope Size: Mac and 3 Grade View: Grade II Tube type: Oral Tube size: 7.0 mm Number of attempts: 1 Airway Equipment and Method: Stylet Placement Confirmation: ETT inserted through vocal cords under direct vision,  positive ETCO2 and breath sounds checked- equal and bilateral Secured at: 21 cm Tube secured with: Tape Dental Injury: Teeth and Oropharynx as per pre-operative assessment

## 2016-08-10 NOTE — H&P (Signed)
GYN ENCOUNTER NOTE  Subjective:  Preoperative history and physical        Brooke [name] is a 30 y.o. G2P0010 female is here for gynecologic evaluation of the following issues:  1. Missed AB  LMP 06/07/2016 EGA 9.0 weeks EDD 03/14/2017  Ultrasound for viability today revealed an intrauterine fetal demise with crown-rump length measuring 9 mm without evidence of fetal cardiac activity previous ultrasound demonstrated fetal cardiac activity at 101 bpm 2 weeks ago. Patient is not experiencing any vaginal bleeding or pelvic cramping.  Last meal was at 11 AM today   ULTRASOUND REPORT  Location: [facility] Date of Service: 08/10/16  Indications:Follow up low heart rate on previous ultrasound Findings:  Singleton intrauterine pregnancy is visualized with a CRL consistent with 6 6/[redacted] weeks gestation, giving an (U/S) EDD of 03/30/17. The (U/S) EDD is NOTconsistent with the clinically established (Prior US) EDD of 03/22/17.  FHR: no fetal heart beat detected CRL measurement: 9 mm  Right Ovary measures 3 x 2.2 x 1.9 cm. It is normal in appearance. Left Ovary measures 2.5 x 1.5 x 1.9 cm. It is normal appearance. There is evidence of a corpus luteal cyst in the Left Survey of the adnexa demonstrates no adnexal masses. There is no free peritoneal fluid in the cul de sac.  Impression: 1. 6 6/7 week NON-Viable Singleton Intrauterine pregnancy by U/S. 2. (U/S) EDD is NOT consistent with Clinically established (Prior US) EDD of 03/22/17 .  Recommendations: 1.Clinical correlation with the patient's History and Physical Exam.  Maria E Hill  Daylen Lipsky A Shirely Toren, MD    Gynecologic History Patient's last menstrual period was 06/07/2016. Contraception: none   Obstetric History OB History  Gravida Para Term Preterm AB Living  2       1    SAB TAB Ectopic Multiple Live Births  1            # Outcome Date GA Lbr Len/2nd Weight Sex Delivery Anes PTL Lv  2 Current            1 SAB 12/07/15        ND      Past Medical History:  Diagnosis Date  . Anxiety   . Migraines     Past Surgical History:  Procedure Laterality Date  . AXILLARY LYMPH NODE BIOPSY Right    benign  . KNEE SURGERY Right    repaired  . TONSILLECTOMY      Current Outpatient Prescriptions on File Prior to Visit  Medication Sig Dispense Refill  . hydrOXYzine (ATARAX/VISTARIL) 25 MG tablet Take 25 mg by mouth at bedtime as needed.    . Milk Thistle 1000 MG CAPS Take by mouth.    . Prenatal Vit-Fe Fumarate-FA (MULTIVITAMIN-PRENATAL) 27-0.8 MG TABS tablet Take 1 tablet by mouth daily at 12 noon.     No current facility-administered medications on file prior to visit.     Allergies  Allergen Reactions  . Hydrocodone Palpitations    Social History   Social History  . Marital status: Divorced    Spouse name: N/A  . Number of children: N/A  . Years of education: N/A   Occupational History  . Not on file.   Social History Main Topics  . Smoking status: Never Smoker  . Smokeless tobacco: Never Used  . Alcohol use Yes     Comment: before pregnancy  . Drug use: No  . Sexual activity: Yes    Partners: Male      Birth control/ protection: None     Comment: Pregnant   Other Topics Concern  . Not on file   Social History Narrative  . No narrative on file    Family History  Problem Relation Age of Onset  . Hypertension Mother   . Migraines Mother   . Diabetes Maternal Grandmother   . Osteoarthritis Maternal Grandmother   . Throat cancer Maternal Grandmother   . Cancer Maternal Grandmother   . Cancer Maternal Grandfather     The following portions of the patient's history were reviewed and updated as appropriate: allergies, current medications, past family history, past medical history, past social history, past surgical history and problem list.  Review of Systems Review of Systems -  No recent illness No history of vaginal bleeding No recent history of  fevers chills or sweats No history of pulmonary disease or reactive airway disease No history of bleeding disorder  Objective:   BP 110/74 (BP Location: Left Arm, Patient Position: Sitting, Cuff Size: Large)   Pulse 85   Ht 5' 4" (1.626 m)   Wt 173 lb 6.4 oz (78.7 kg)   LMP 06/07/2016   BMI 29.76 kg/m  CONSTITUTIONAL: Well-developed, well-nourished female in no acute distress.  HENT:  Normocephalic, atraumatic. Oropharynx clear NECK: Normal range of motion, supple, no masses.  Normal thyroid.  SKIN: Skin is warm and dry. No rash noted. Not diaphoretic. No erythema. No pallor. NEUROLGIC: Alert and oriented to person, place, and time. PSYCHIATRIC: Normal mood and affect. Normal behavior. Normal judgment and thought content. CARDIOVASCULAR:Regular rate and rhythm without murmur RESPIRATORY: Clear BREASTS: Not Examined ABDOMEN: Soft, non distended; Non tender.  No Organomegaly. PELVIC: Deferred to the OR MUSCULOSKELETAL: Normal range of motion. No tenderness.  No cyanosis, clubbing, or edema.     Assessment:   Missed AB-first trimester   Plan:   Suction D&C date of surgery 08/10/2016  Preoperative counseling: The patient is to undergo suction D&C for missed AB. She is understanding of the planned procedure and is aware of and is accepting of all surgical risks which include but are not limited to bleeding, infection, pelvic organ injury with need for repair, blood clot disorders, anesthesia risks, etc. All questions have been answered. Informed consent is given. Patient is ready and willing to proceed with surgery as scheduled.  Alyas Creary A Siddhi Dornbush, MD  Note: This dictation was prepared with Dragon dictation along with smaller phrase technology. Any transcriptional errors that result from this process are unintentional.   

## 2016-08-10 NOTE — Patient Instructions (Signed)
1. Suction D&C for missed AB today 2. Return in 1 week for postop check

## 2016-08-10 NOTE — Progress Notes (Signed)
GYN ENCOUNTER NOTE  Subjective:  Preoperative history and physical        Brooke [name] is a 30 y.o. [id number] female is here for gynecologic evaluation of the following issues:  1. Missed AB  LMP 06/07/2016 EGA 9.0 weeks EDD 03/14/2017  Ultrasound for viability today revealed an intrauterine fetal demise with crown-rump length measuring 9 mm without evidence of fetal cardiac activity previous ultrasound demonstrated fetal cardiac activity at 101 bpm 2 weeks ago. Patient is not experiencing any vaginal bleeding or pelvic cramping.  Last meal was at 11 AM today   ULTRASOUND REPORT  Location: [facility] Date of Service: 08/10/16  Indications:Follow up low heart rate on previous ultrasound Findings:  Singleton intrauterine pregnancy is visualized with a CRL consistent with 6 6/[redacted] weeks gestation, giving an (U/S) EDD of 03/30/17. The (U/S) EDD is NOTconsistent with the clinically established (Prior US) EDD of 03/22/17.  FHR: no fetal heart beat detected CRL measurement: 9 mm  Right Ovary measures 3 x 2.2 x 1.9 cm. It is normal in appearance. Left Ovary measures 2.5 x 1.5 x 1.9 cm. It is normal appearance. There is evidence of a corpus luteal cyst in the Left Survey of the adnexa demonstrates no adnexal masses. There is no free peritoneal fluid in the cul de sac.  Impression: 1. 6 6/7 week NON-Viable Singleton Intrauterine pregnancy by U/S. 2. (U/S) EDD is NOT consistent with Clinically established (Prior US) EDD of 03/22/17 .  Recommendations: 1.Clinical correlation with the patient's History and Physical Exam.  Brooke [name]  Herold HarmsMartin A Zuleyma Scharf, MD    Gynecologic History Patient's last menstrual period was 06/07/2016. Contraception: none   Obstetric History OB History  Gravida Para Term Preterm AB Living  2       1    SAB TAB Ectopic Multiple Live Births  1            # Outcome Date GA Lbr Len/2nd Weight Sex Delivery Anes PTL Lv  2 Current            1 SAB 12/07/15        ND      Past Medical History:  Diagnosis Date  . Anxiety   . Migraines     Past Surgical History:  Procedure Laterality Date  . AXILLARY LYMPH NODE BIOPSY Right    benign  . KNEE SURGERY Right    repaired  . TONSILLECTOMY      Current Outpatient Prescriptions on File Prior to Visit  Medication Sig Dispense Refill  . hydrOXYzine (ATARAX/VISTARIL) 25 MG tablet Take 25 mg by mouth at bedtime as needed.    . Milk Thistle 1000 MG CAPS Take by mouth.    . Prenatal Vit-Fe Fumarate-FA (MULTIVITAMIN-PRENATAL) 27-0.8 MG TABS tablet Take 1 tablet by mouth daily at 12 noon.     No current facility-administered medications on file prior to visit.     Allergies  Allergen Reactions  . Hydrocodone Palpitations    Social History   Social History  . Marital status: Divorced    Spouse name: N/A  . Number of children: N/A  . Years of education: N/A   Occupational History  . Not on file.   Social History Main Topics  . Smoking status: Never Smoker  . Smokeless tobacco: Never Used  . Alcohol use Yes     Comment: before pregnancy  . Drug use: No  . Sexual activity: Yes    Partners: Male  Birth control/ protection: None     Comment: Pregnant   Other Topics Concern  . Not on file   Social History Narrative  . No narrative on file    Family History  Problem Relation Age of Onset  . Hypertension Mother   . Migraines Mother   . Diabetes Maternal Grandmother   . Osteoarthritis Maternal Grandmother   . Throat cancer Maternal Grandmother   . Cancer Maternal Grandmother   . Cancer Maternal Grandfather     The following portions of the patient's history were reviewed and updated as appropriate: allergies, current medications, past family history, past medical history, past social history, past surgical history and problem list.  Review of Systems Review of Systems -  No recent illness No history of vaginal bleeding No recent history of  fevers chills or sweats No history of pulmonary disease or reactive airway disease No history of bleeding disorder  Objective:   BP 110/74 (BP Location: Left Arm, Patient Position: Sitting, Cuff Size: Large)   Pulse 85   Ht 5\' 4"  (1.626 m)   Wt 173 lb 6.4 oz (78.7 kg)   LMP 06/07/2016   BMI 29.76 kg/m  CONSTITUTIONAL: Well-developed, well-nourished female in no acute distress.  HENT:  Normocephalic, atraumatic. Oropharynx clear NECK: Normal range of motion, supple, no masses.  Normal thyroid.  SKIN: Skin is warm and dry. No rash noted. Not diaphoretic. No erythema. No pallor. NEUROLGIC: Alert and oriented to person, place, and time. PSYCHIATRIC: Normal mood and affect. Normal behavior. Normal judgment and thought content. CARDIOVASCULAR:Regular rate and rhythm without murmur RESPIRATORY: Clear BREASTS: Not Examined ABDOMEN: Soft, non distended; Non tender.  No Organomegaly. PELVIC: Deferred to the OR MUSCULOSKELETAL: Normal range of motion. No tenderness.  No cyanosis, clubbing, or edema.     Assessment:   Missed AB-first trimester   Plan:   Suction D&C date of surgery 08/10/2016  Preoperative counseling: The patient is to undergo suction D&C for missed AB. She is understanding of the planned procedure and is aware of and is accepting of all surgical risks which include but are not limited to bleeding, infection, pelvic organ injury with need for repair, blood clot disorders, anesthesia risks, etc. All questions have been answered. Informed consent is given. Patient is ready and willing to proceed with surgery as scheduled.  Herold Harms, MD  Note: This dictation was prepared with Dragon dictation along with smaller phrase technology. Any transcriptional errors that result from this process are unintentional.

## 2016-08-10 NOTE — Anesthesia Preprocedure Evaluation (Addendum)
Anesthesia Evaluation  Patient identified by MRN, date of birth, ID band Patient awake    Reviewed: Allergy & Precautions, NPO status , Patient's Chart, lab work & pertinent test results  Airway Mallampati: II  TM Distance: >3 FB     Dental no notable dental hx.    Pulmonary neg pulmonary ROS,    Pulmonary exam normal        Cardiovascular negative cardio ROS Normal cardiovascular exam     Neuro/Psych  Headaches, PSYCHIATRIC DISORDERS Anxiety Depression    GI/Hepatic negative GI ROS, Neg liver ROS,   Endo/Other  negative endocrine ROS  Renal/GU negative Renal ROS  negative genitourinary   Musculoskeletal negative musculoskeletal ROS (+)   Abdominal Normal abdominal exam  (+)   Peds negative pediatric ROS (+)  Hematology negative hematology ROS (+)   Anesthesia Other Findings   Reproductive/Obstetrics                            Anesthesia Physical Anesthesia Plan  ASA: II and emergent  Anesthesia Plan: General   Post-op Pain Management:    Induction: Intravenous  Airway Management Planned: Oral ETT  Additional Equipment:   Intra-op Plan:   Post-operative Plan: Extubation in OR  Informed Consent: I have reviewed the patients History and Physical, chart, labs and discussed the procedure including the risks, benefits and alternatives for the proposed anesthesia with the patient or authorized representative who has indicated his/her understanding and acceptance.   Dental advisory given  Plan Discussed with: CRNA and Surgeon  Anesthesia Plan Comments:        Anesthesia Quick Evaluation

## 2016-08-10 NOTE — Transfer of Care (Signed)
Immediate Anesthesia Transfer of Care Note  Patient: [name]  Procedure(s) Performed: Procedure(s): DILATATION AND EVACUATION (N/A)  Patient Location: PACU  Anesthesia Type:General  Level of Consciousness: awake and alert   Airway & Oxygen Therapy: Patient Spontanous Breathing and Patient connected to face mask oxygen  Post-op Assessment: Report given to RN and Post -op Vital signs reviewed and stable  Post vital signs: Reviewed and stable  Last Vitals:  Vitals:   08/10/16 1741  BP: (!) 124/91  Pulse: 80  Resp: 16  Temp: 37.1 C    Last Pain:  Vitals:   08/10/16 1741  TempSrc: Tympanic         Complications: No apparent anesthesia complications

## 2016-08-10 NOTE — Anesthesia Postprocedure Evaluation (Signed)
Anesthesia Post Note  [name]  Procedure(s) Performed: Procedure(s) (LRB): DILATATION AND EVACUATION (N/A)  Patient location during evaluation: PACU Anesthesia Type: General Level of consciousness: awake and alert and oriented Pain management: pain level controlled Vital Signs Assessment: post-procedure vital signs reviewed and stable Respiratory status: spontaneous breathing Cardiovascular status: blood pressure returned to baseline Anesthetic complications: no    Last Vitals:  Vitals:   08/10/16 1911 08/10/16 1918  BP: 123/88   Pulse: 91 81  Resp: 17 11  Temp: 36.5 C     Last Pain:  Vitals:   08/10/16 1918  TempSrc:   PainSc: 0-No pain                 Draiden Mirsky

## 2016-08-10 NOTE — Interval H&P Note (Signed)
History and Physical Interval Note:  08/10/2016 6:18 PM  Brooke [name]  has presented today for surgery, with the diagnosis of MISSED AB  The various methods of treatment have been discussed with the patient and family. After consideration of risks, benefits and other options for treatment, the patient has consented to  Procedure(s): DILATATION AND EVACUATION (N/A) as a surgical intervention .  The patient's history has been reviewed, patient examined, no change in status, stable for surgery.  I have reviewed the patient's chart and labs.  Questions were answered to the patient's satisfaction.     Daphine DeutscherMartin A Bridgitt Raggio

## 2016-08-10 NOTE — Op Note (Signed)
[facility] PROCEDURE DATE: 08/10/2016 7:00 PM  PREOPERATIVE DIAGNOSIS:  1. MISSED ABORTION 2. A+ blood type  POSTOPERATIVE DIAGNOSIS:  1. MISSED ABORTION 2. A+ blood type  PROCEDURE: Procedure(s): DILATATION AND EVACUATION (N/A) SURGEON:  Dr. Daphine DeutscherMartin A Annora Guderian ASSISTANT: None ANESTHESIA: General  INDICATIONS: 30 y.o. G2P0010 with history ofMissed AB at [redacted] weeks gestation presents for surgical management.   Please see preoperative notes for further details.   FINDINGS:  Uterus wasMidplane and 10 week size. Tissue consistent with products of conception was removed; [facility] and 50% sent to Pathology.   I/O's: Total I/O In: 600 [I.V.:600] Out: 150 [Urine:100; Blood:50] SPECIMENS: POC ([facility]; 50% sent to pathology) COMPLICATIONS: None immediate COUNTS:  YES  PROCEDURE IN DETAIL: The patient was brought to the operating room where she was placed into the supine position.  General endotracheal anesthesia was induced without incident. She was placed in the dorsal lithotomy position using bumblebee stirrups, and was examined; A Betadine prep and drape was performed in sterile fashion.   A  Red Robinson catheter was used to drain the bladder of urine. A weighted speculum was placed into  the vagina and a single tooth tenaculum was applied to the anterior lip of the cervix. The cervix was gently dilated using Hank's Dilators to accommodate a 10 mm suction curette, that was gently advanced to the uterine fundus.  The suction device was then activated and the curette was slowly rotated to clear the uterine cavity of products of conception.  A sharp curettage with a serrated curette  was then performed to confirm complete emptying of the uterus. Minimal bleeding was encountered. The tenaculum was removed along with all instruments  from the  vagina.   The patient was awakened, mobilized and taken  to the recovery room in satisfactory condition.  The procedure was well-tolerated.  The patient will be discharged to home as per PACU criteria.  Routine postoperative instructions were given along with a prescription for analgesics.  She will follow up in the clinic in 1-2 weeks for postoperative evaluation.  Herold HarmsMartin A Ajna Moors, MD [facility]

## 2016-08-11 ENCOUNTER — Encounter: Payer: Self-pay | Admitting: Obstetrics and Gynecology

## 2016-08-15 LAB — SURGICAL PATHOLOGY

## 2016-08-15 NOTE — Telephone Encounter (Signed)
Pt came in on 08/10/16 for ultrasound.  Pt. Had non-viable pregnancy. No FHR detected. Dilation and evacuation done on 08/10/16.

## 2016-08-17 ENCOUNTER — Encounter: Payer: Managed Care, Other (non HMO) | Admitting: Obstetrics and Gynecology

## 2016-08-17 ENCOUNTER — Ambulatory Visit: Payer: Managed Care, Other (non HMO) | Admitting: Obstetrics and Gynecology

## 2016-08-24 ENCOUNTER — Encounter: Payer: Self-pay | Admitting: Obstetrics and Gynecology

## 2016-08-29 ENCOUNTER — Encounter: Payer: Managed Care, Other (non HMO) | Admitting: Obstetrics and Gynecology

## 2017-01-19 ENCOUNTER — Encounter: Payer: Self-pay | Admitting: Emergency Medicine

## 2017-01-19 ENCOUNTER — Ambulatory Visit
Admission: EM | Admit: 2017-01-19 | Discharge: 2017-01-19 | Disposition: A | Discharge status: Home / Self Care | Payer: BLUE CROSS/BLUE SHIELD | Attending: Family Medicine | Admitting: Family Medicine

## 2017-01-19 DIAGNOSIS — J069 Acute upper respiratory infection, unspecified: Secondary | ICD-10-CM

## 2017-01-19 DIAGNOSIS — J0111 Acute recurrent frontal sinusitis: Secondary | ICD-10-CM

## 2017-01-19 MED ORDER — DEXTROMETHORPHAN HBR 15 MG/5ML PO SYRP: 10.0000 mL | ORAL_SOLUTION | Freq: Four times a day (QID) | ORAL | 0 refills | Status: DC | PRN

## 2017-01-19 MED ORDER — AZITHROMYCIN 250 MG PO TABS: 250.0000 mg | ORAL_TABLET | Freq: Every day | ORAL | 0 refills | Status: DC

## 2017-01-19 NOTE — ED Triage Notes (Signed)
Patient c/o sinus pain, sore throat and HAs that started for the past few days.

## 2017-01-19 NOTE — ED Provider Notes (Signed)
CSN: [id number]     Arrival date & time 01/19/17  1144 History   First MD Initiated Contact with Patient 01/19/17 1252     Chief Complaint  Patient presents with  . Sore Throat  . Headache   (Consider location/radiation/quality/duration/timing/severity/associated sxs/prior Treatment) C/o cough, sinus pressure, headache intemit, low grade fever 1 week . Has a hx of sinus infection. Denies any n/v/d. Has taken otc cough meds.       Past Medical History:  Diagnosis Date  . Anxiety   . Migraines    Past Surgical History:  Procedure Laterality Date  . AXILLARY LYMPH NODE BIOPSY Right    benign  . DILATION AND EVACUATION N/A 08/10/2016   Procedure: DILATATION AND EVACUATION;  Surgeon: Herold Harms, MD;  Location: [facility] ORS;  Service: Gynecology;  Laterality: N/A;  . KNEE SURGERY Right    repaired  . TONSILLECTOMY     Family History  Problem Relation Age of Onset  . Hypertension Mother   . Migraines Mother   . Diabetes Maternal Grandmother   . Osteoarthritis Maternal Grandmother   . Throat cancer Maternal Grandmother   . Cancer Maternal Grandmother   . Cancer Maternal Grandfather    Social History  Substance Use Topics  . Smoking status: Never Smoker  . Smokeless tobacco: Never Used  . Alcohol use Yes     Comment: before pregnancy   OB History    Gravida Para Term Preterm AB Living   2       1     SAB TAB Ectopic Multiple Live Births   1             Review of Systems  Constitutional: Negative.   HENT: Positive for congestion, rhinorrhea, sinus pain and sinus pressure.   Eyes: Negative.   Respiratory: Positive for cough.   Cardiovascular: Negative.   Gastrointestinal: Negative.   Musculoskeletal: Negative.   Skin: Negative.   Neurological: Positive for headaches.       Intemit , no aura, no n/v     Allergies  Hydrocodone  Home Medications   Prior to Admission medications   Medication Sig Start Date End Date Taking? Authorizing Provider   DULoxetine (CYMBALTA) 30 MG capsule Take 30 mg by mouth daily.   Yes Historical Provider, MD  lisdexamfetamine (VYVANSE) 40 MG capsule Take 40 mg by mouth every morning.   Yes Historical Provider, MD  azithromycin (ZITHROMAX) 250 MG tablet Take 1 tablet (250 mg total) by mouth daily. Take first 2 tablets together, then 1 every day until finished. 01/19/17   Tobi Bastos, NP  dextromethorphan 15 MG/5ML syrup Take 10 mLs (30 mg total) by mouth 4 (four) times daily as needed for cough. 01/19/17   Tobi Bastos, NP  ibuprofen (ADVIL,MOTRIN) 800 MG tablet Take 1 tablet (800 mg total) by mouth 3 (three) times daily. 08/10/16   Prentice Docker Defrancesco, MD  Milk Thistle 1000 MG CAPS Take by mouth.    Historical Provider, MD  oxyCODONE-acetaminophen (ROXICET) 5-325 MG tablet Take 1-2 tablets by mouth every 4 (four) hours as needed for moderate pain or severe pain. 08/10/16   Prentice Docker Defrancesco, MD  Prenatal Vit-Fe Fumarate-FA (MULTIVITAMIN-PRENATAL) 27-0.8 MG TABS tablet Take 1 tablet by mouth daily at 12 noon.    Historical Provider, MD   Meds Ordered and Administered this Visit  Medications - No data to display  BP 139/86 (BP Location: Left Arm)   Pulse 85   Temp 99 F (  37.2 C) (Oral)   Resp 16   Ht 5\' 3"  (1.6 m)   Wt 175 lb (79.4 kg)   LMP 06/07/2016   SpO2 100%   Breastfeeding? No   BMI 31.00 kg/m  No data found.   Physical Exam  Constitutional: She appears well-developed.  HENT:  Right Ear: External ear normal.  Left Ear: External ear normal.  Mouth/Throat: Oropharynx is clear and moist.  Pressure to frontal sinus, thick nasal drainage.   Eyes: Pupils are equal, round, and reactive to light.  Neck: Normal range of motion.  Cardiovascular: Normal rate and regular rhythm.   Pulmonary/Chest: Effort normal and breath sounds normal.  Abdominal: Soft. Bowel sounds are normal.  Musculoskeletal: Normal range of motion.  Neurological: She is alert.  Skin: Skin is warm.    Urgent  Care Course     Procedures (including critical care time)  Labs Review Labs Reviewed - No data to display  Imaging Review No results found.        MDM   1. Upper respiratory tract infection, unspecified type   2. Acute recurrent frontal sinusitis    Discussed treatment options May use humidifier to help  Take tylenol or motrin as needed Cough meds as needed May need to take an daily allergy med such as zyrtec or Claritin.      Tobi BastosMelanie A Britny Riel, NP 01/19/17 1308

## 2017-04-05 ENCOUNTER — Emergency Department: Payer: Worker's Compensation

## 2017-04-05 ENCOUNTER — Emergency Department
Admission: EM | Admit: 2017-04-05 | Discharge: 2017-04-05 | Disposition: A | Discharge status: Home / Self Care | Payer: Worker's Compensation | Attending: Emergency Medicine | Admitting: Emergency Medicine

## 2017-04-05 DIAGNOSIS — Y990 Civilian activity done for income or pay: Secondary | ICD-10-CM | POA: Insufficient documentation

## 2017-04-05 DIAGNOSIS — Z79899 Other long term (current) drug therapy: Secondary | ICD-10-CM | POA: Diagnosis not present

## 2017-04-05 DIAGNOSIS — F909 Attention-deficit hyperactivity disorder, unspecified type: Secondary | ICD-10-CM | POA: Insufficient documentation

## 2017-04-05 DIAGNOSIS — Y92238 Other place in hospital as the place of occurrence of the external cause: Secondary | ICD-10-CM | POA: Insufficient documentation

## 2017-04-05 DIAGNOSIS — W5501XA Bitten by cat, initial encounter: Secondary | ICD-10-CM | POA: Diagnosis not present

## 2017-04-05 DIAGNOSIS — Y9389 Activity, other specified: Secondary | ICD-10-CM | POA: Diagnosis not present

## 2017-04-05 DIAGNOSIS — S61451A Open bite of right hand, initial encounter: Secondary | ICD-10-CM | POA: Diagnosis present

## 2017-04-05 DIAGNOSIS — S61031A Puncture wound without foreign body of right thumb without damage to nail, initial encounter: Secondary | ICD-10-CM | POA: Diagnosis not present

## 2017-04-05 MED ORDER — BACITRACIN ZINC 500 UNIT/GM EX OINT
TOPICAL_OINTMENT | Freq: Two times a day (BID) | CUTANEOUS | Status: DC
  Administered 2017-04-05: 1 via TOPICAL

## 2017-04-05 MED ORDER — AMOXICILLIN-POT CLAVULANATE 875-125 MG PO TABS
1.0000 | ORAL_TABLET | Freq: Once | ORAL | Status: AC
  Administered 2017-04-05: 1 via ORAL
  Filled 2017-04-05: qty 1

## 2017-04-05 MED ORDER — IBUPROFEN 600 MG PO TABS
600.0000 mg | ORAL_TABLET | Freq: Once | ORAL | Status: AC
  Administered 2017-04-05: 600 mg via ORAL
  Filled 2017-04-05: qty 1

## 2017-04-05 MED ORDER — AMOXICILLIN-POT CLAVULANATE 875-125 MG PO TABS: 1.0000 | ORAL_TABLET | Freq: Two times a day (BID) | ORAL | 0 refills | Status: AC

## 2017-04-05 MED ORDER — BACITRACIN ZINC 500 UNIT/GM EX OINT
TOPICAL_OINTMENT | CUTANEOUS | Status: AC
  Administered 2017-04-05: 1 via TOPICAL
  Filled 2017-04-05: qty 0.9

## 2017-04-05 NOTE — ED Provider Notes (Signed)
[facility] Emergency Department Provider Note  ____________________________________________  Time seen: Approximately 5:47 AM  I have reviewed the triage vital signs and the nursing notes.   HISTORY  Chief Complaint Animal Bite   HPI Brooke [name] is a 31 y.o. female no significant comorbidities and presents for evaluation of a cat bite to her right hand. 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 Fully vaccinated with no evidence of rabies. The cat's owner has been made aware of what happened. Patient was sent here for further evaluation.Patient is right-handed. She is fully vaccinated and her tetanus shot is up-to-date. She has no other complaints at this time. She is complaining of swelling and redness around the bite on the base of her right thumb, has had moderate pain since the incident which is constant and nonradiating.  Past Medical History:  Diagnosis Date  . Anxiety   . Migraines     Patient Active Problem List   Diagnosis Date Noted  . ADHD (attention deficit hyperactivity disorder) 07/26/2016  . Anxiety 07/26/2016  . Rhinitis, allergic 04/07/2015  . Migraines 03/03/2015  . Hx of vertigo 10/06/2014  . Depression 06/17/2014  . Major depressive disorder, single episode 06/17/2014  . Acne 03/19/2014    Past Surgical History:  Procedure Laterality Date  . AXILLARY LYMPH NODE BIOPSY Right    benign  . DILATION AND EVACUATION N/A 08/10/2016   Procedure: DILATATION AND EVACUATION;  Surgeon: Herold Harms, MD;  Location: [facility] ORS;  Service: Gynecology;  Laterality: N/A;  . KNEE SURGERY Right    repaired  . TONSILLECTOMY      Prior to Admission medications   Medication Sig Start Date End Date Taking? Authorizing Provider  amoxicillin-clavulanate (AUGMENTIN) 875-125 MG tablet Take 1 tablet by mouth 2 (two) times daily. 04/05/17 04/15/17  Nita Sickle, MD  azithromycin  (ZITHROMAX) 250 MG tablet Take 1 tablet (250 mg total) by mouth daily. Take first 2 tablets together, then 1 every day until finished. 01/19/17   Tobi Bastos, NP  dextromethorphan 15 MG/5ML syrup Take 10 mLs (30 mg total) by mouth 4 (four) times daily as needed for cough. 01/19/17   Tobi Bastos, NP  DULoxetine (CYMBALTA) 30 MG capsule Take 30 mg by mouth daily.    Historical Provider, MD  ibuprofen (ADVIL,MOTRIN) 800 MG tablet Take 1 tablet (800 mg total) by mouth 3 (three) times daily. 08/10/16   Prentice Docker Defrancesco, MD  lisdexamfetamine (VYVANSE) 40 MG capsule Take 40 mg by mouth every morning.    Historical Provider, MD  Milk Thistle 1000 MG CAPS Take by mouth.    Historical Provider, MD  oxyCODONE-acetaminophen (ROXICET) 5-325 MG tablet Take 1-2 tablets by mouth every 4 (four) hours as needed for moderate pain or severe pain. 08/10/16   Prentice Docker Defrancesco, MD  Prenatal Vit-Fe Fumarate-FA (MULTIVITAMIN-PRENATAL) 27-0.8 MG TABS tablet Take 1 tablet by mouth daily at 12 noon.    Historical Provider, MD    Allergies Hydrocodone  Family History  Problem Relation Age of Onset  . Hypertension Mother   . Migraines Mother   . Diabetes Maternal Grandmother   . Osteoarthritis Maternal Grandmother   . Throat cancer Maternal Grandmother   . Cancer Maternal Grandmother   . Cancer Maternal Grandfather     Social History Social History  Substance Use Topics  . Smoking status: Never Smoker  . Smokeless tobacco: Never Used  .  Alcohol use Yes     Comment: before pregnancy    Review of Systems  Constitutional: Negative for fever. Eyes: Negative for visual changes. ENT: Negative for sore throat. Neck: No neck pain  Cardiovascular: Negative for chest pain. Respiratory: Negative for shortness of breath. Gastrointestinal: Negative for abdominal pain, vomiting or diarrhea. Genitourinary: Negative for dysuria. Musculoskeletal: Negative for back pain. Skin: Negative for rash. + cat  bite to hand Neurological: Negative for headaches, weakness or numbness. Psych: No SI or HI  ____________________________________________   PHYSICAL EXAM:  VITAL SIGNS: ED Triage Vitals  Enc Vitals Group     BP 04/05/17 0048 (!) 141/95     Pulse Rate 04/05/17 0048 (!) 110     Resp 04/05/17 0048 18     Temp 04/05/17 0048 98 F (36.7 C)     Temp Source 04/05/17 0048 Oral     SpO2 04/05/17 0048 100 %     Weight 04/05/17 0046 165 lb (74.8 kg)     Height 04/05/17 0046  (1.6 m)     Head Circumference --      Peak Flow --      Pain Score 04/05/17 0046 4     Pain Loc --      Pain Edu? --      Excl. in GC? --     Constitutional: Alert and oriented. Well appearing and in no apparent distress. HEENT:      Head: Normocephalic and atraumatic.         Eyes: Conjunctivae are normal. Sclera is non-icteric. EOMI. PERRL      Mouth/Throat: Mucous membranes are moist.       Neck: Supple with no signs of meningismus. Cardiovascular: Regular rate and rhythm. No murmurs, gallops, or rubs. 2+ symmetrical distal pulses are present in all extremities. No JVD. Respiratory: Normal respiratory effort. Lungs are clear to auscultation bilaterally. No wheezes, crackles, or rhonchi.  Gastrointestinal: Soft, non tender, and non distended with positive bowel sounds. No rebound or guarding. Musculoskeletal: Nontender with normal range of motion in all extremities. No edema, cyanosis, or erythema of extremities. Neurologic: Normal speech and language. Face is symmetric. Moving all extremities. No gross focal neurologic deficits are appreciated. Skin: Skin is warm, dry and intact. Two small puncture wounds seen one at the base of the R thumb and one on the dorsum of the hand, there is mild erythema and swelling surrounding the bite on the thumb, no streaking, no tendon involvement. Psychiatric: Mood and affect are normal. Speech and behavior are normal.  ____________________________________________     LABS (all labs ordered are listed, but only abnormal results are displayed)  Labs Reviewed - No data to display ____________________________________________  EKG  none ____________________________________________  RADIOLOGY  XR hand: Negative ____________________________________________   PROCEDURES  Procedure(s) performed: None Procedures Critical Care performed:  None ____________________________________________   INITIAL IMPRESSION / ASSESSMENT AND PLAN / ED COURSE  31 y.o. female no significant comorbidities and presents for evaluation of a cat bite to her right hand. X-ray with no evidence of foreign body. Wound was washed thoroughly and bacitracin was applied. Patient was started on Augmentin. Erythematous area was demarcated and patient was instructed to pay close attention and return to the emergency room if the redness is progressing, if the pain is getting worse, if she notices pus, worsening swelling, or fever. Otherwise patient is going to follow-up with her primary care doctor. Tetanus shot was up-to-date.     Pertinent labs & imaging  results that were available during my care of the patient were reviewed by me and considered in my medical decision making (see chart for details).    ____________________________________________   FINAL CLINICAL IMPRESSION(S) / ED DIAGNOSES  Final diagnoses:  Cat bite of right hand, initial encounter      NEW MEDICATIONS STARTED DURING THIS VISIT:  New Prescriptions   AMOXICILLIN-CLAVULANATE (AUGMENTIN) 875-125 MG TABLET    Take 1 tablet by mouth 2 (two) times daily.     Note:  This document was prepared using Dragon voice recognition software and may include unintentional dictation errors.    Nita Sickle, MD 04/05/17 [id number]

## 2017-04-05 NOTE — ED Triage Notes (Addendum)
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

## 2017-04-10 DIAGNOSIS — F411 Generalized anxiety disorder: Secondary | ICD-10-CM | POA: Insufficient documentation

## 2017-08-17 ENCOUNTER — Ambulatory Visit (INDEPENDENT_AMBULATORY_CARE_PROVIDER_SITE_OTHER): Payer: Worker's Compensation

## 2017-08-17 ENCOUNTER — Encounter: Payer: Self-pay | Admitting: Emergency Medicine

## 2017-08-17 ENCOUNTER — Ambulatory Visit
Admission: EM | Admit: 2017-08-17 | Discharge: 2017-08-17 | Disposition: A | Discharge status: Home / Self Care | Payer: Worker's Compensation | Attending: Family Medicine | Admitting: Family Medicine

## 2017-08-17 DIAGNOSIS — S61259A Open bite of unspecified finger without damage to nail, initial encounter: Secondary | ICD-10-CM | POA: Diagnosis not present

## 2017-08-17 DIAGNOSIS — W540XXA Bitten by dog, initial encounter: Secondary | ICD-10-CM | POA: Diagnosis not present

## 2017-08-17 MED ORDER — AMOXICILLIN-POT CLAVULANATE 875-125 MG PO TABS: 1.0000 | ORAL_TABLET | Freq: Two times a day (BID) | ORAL | 0 refills | Status: DC

## 2017-08-17 MED ORDER — MUPIROCIN 2 % EX OINT: 1.0000 "application " | TOPICAL_OINTMENT | Freq: Three times a day (TID) | CUTANEOUS | 0 refills | Status: DC

## 2017-08-17 NOTE — ED Provider Notes (Signed)
[facility]    CSN: [id number] Arrival date & time: 08/17/17  1116     History   Chief Complaint Chief Complaint  Patient presents with  . Finger Injury    HPI Brooke [name] is a 31 y.o. female.   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 She states she washed it well but did not seek medical attention immediately. She is fully sedated against rabies and has a current tetanus toxoid. She states she had extra Augmentin at home and took one earlier today. He states that the wound bled quite a bit initially but has since stopped.        Past Medical History:  Diagnosis Date  . Anxiety   . Migraines     Patient Active Problem List   Diagnosis Date Noted  . ADHD (attention deficit hyperactivity disorder) 07/26/2016  . Anxiety 07/26/2016  . Rhinitis, allergic 04/07/2015  . Migraines 03/03/2015  . Hx of vertigo 10/06/2014  . Depression 06/17/2014  . Major depressive disorder, single episode 06/17/2014  . Acne 03/19/2014    Past Surgical History:  Procedure Laterality Date  . AXILLARY LYMPH NODE BIOPSY Right    benign  . DILATION AND EVACUATION N/A 08/10/2016   Procedure: DILATATION AND EVACUATION;  Surgeon: Herold HarmsMartin A Defrancesco, MD;  Location: [facility] ORS;  Service: Gynecology;  Laterality: N/A;  . KNEE SURGERY Right    repaired  . TONSILLECTOMY      OB History    Gravida Para Term Preterm AB Living   2       1     SAB TAB Ectopic Multiple Live Births   1               Home Medications    Prior to Admission medications   Medication Sig Start Date End Date Taking? Authorizing Provider  DULoxetine (CYMBALTA) 30 MG capsule Take 30 mg by mouth daily.   Yes [provider]  hydrOXYzine (ATARAX/VISTARIL) 25 MG tablet Take 25 mg by mouth 3 (three) times daily as needed.   Yes [provider]  ibuprofen (ADVIL,MOTRIN) 800 MG tablet Take  1 tablet (800 mg total) by mouth 3 (three) times daily. 08/10/16  Yes Defrancesco, Prentice DockerMartin A, MD  lisdexamfetamine (VYVANSE) 40 MG capsule Take 40 mg by mouth every morning.   Yes [provider]  amoxicillin-clavulanate (AUGMENTIN) 875-125 MG tablet Take 1 tablet by mouth every 12 (twelve) hours. 08/17/17   Lutricia Feiloemer, William P, PA-C  azithromycin (ZITHROMAX) 250 MG tablet Take 1 tablet (250 mg total) by mouth daily. Take first 2 tablets together, then 1 every day until finished. 01/19/17   Tobi BastosMitchell, Melanie A, NP  dextromethorphan 15 MG/5ML syrup Take 10 mLs (30 mg total) by mouth 4 (four) times daily as needed for cough. 01/19/17   Tobi BastosMitchell, Melanie A, NP  Milk Thistle 1000 MG CAPS Take by mouth.    [provider]  mupirocin ointment (BACTROBAN) 2 % Apply 1 application topically 3 (three) times daily. 08/17/17   Lutricia Feiloemer, William P, PA-C  oxyCODONE-acetaminophen (ROXICET) 5-325 MG tablet Take 1-2 tablets by mouth every 4 (four) hours as needed for moderate pain or severe pain. 08/10/16   Defrancesco, Prentice DockerMartin A, MD  Prenatal Vit-Fe Fumarate-FA (MULTIVITAMIN-PRENATAL) 27-0.8 MG TABS tablet Take 1 tablet by mouth daily at 12 noon.    [provider]    Family History Family  History  Problem Relation Age of Onset  . Hypertension Mother   . Migraines Mother   . Diabetes Maternal Grandmother   . Osteoarthritis Maternal Grandmother   . Throat cancer Maternal Grandmother   . Cancer Maternal Grandmother   . Cancer Maternal Grandfather     Social History Social History  Substance Use Topics  . Smoking status: Never Smoker  . Smokeless tobacco: Never Used  . Alcohol use Yes     Comment: before pregnancy     Allergies   Hydrocodone   Review of Systems Review of Systems  Constitutional: Positive for activity change. Negative for chills, fatigue and fever.  Skin: Positive for wound.  All other systems reviewed and are negative.    Physical Exam Triage Vital Signs ED  Triage Vitals  Enc Vitals Group     BP 08/17/17 1320 (!) 144/96     Pulse Rate 08/17/17 1320 96     Resp 08/17/17 1320 18     Temp 08/17/17 1320 98.7 F (37.1 C)     Temp Source 08/17/17 1320 Oral     SpO2 08/17/17 1320 100 %     Weight 08/17/17 1319 168 lb (76.2 kg)     Height 08/17/17 1319  (1.6 m)     Head Circumference --      Peak Flow --      Pain Score 08/17/17 1321 7     Pain Loc --      Pain Edu? --      Excl. in GC? --    No data found.   Updated Vital Signs BP (!) 144/96   Pulse 96   Temp 98.7 F (37.1 C) (Oral)   Resp 18   Ht  (1.6 m)   Wt 168 lb (76.2 kg)   LMP 08/01/2017 (Exact Date)   SpO2 100%   BMI 29.76 kg/m   Visual Acuity Right Eye Distance:   Left Eye Distance:   Bilateral Distance:    Right Eye Near:   Left Eye Near:    Bilateral Near:     Physical Exam  Constitutional: She appears well-developed and well-nourished. No distress.  HENT:  Head: Normocephalic.  Eyes: Pupils are equal, round, and reactive to light.  Neck: Normal range of motion.  Musculoskeletal:  Examination of the left nondominant hand shows a puncture wound over the joint of the index finger ulnar aspect. Patient has discomfort with any motion of the finger. She does have swelling of her hand from dependency. There is no active bleeding at the present time. There is no discharge from the wound. Refer to photographs for detail  Skin: She is not diaphoretic.  Nursing note and vitals reviewed.        UC Treatments / Results  Labs (all labs ordered are listed, but only abnormal results are displayed) Labs Reviewed - No data to display  EKG  EKG Interpretation None       Radiology Dg Finger Index Left  Result Date: 08/17/2017 CLINICAL DATA:  Pt with left 2nd finger/index of the left hand pian after dog bit early this morning. EXAM: LEFT INDEX FINGER 2+V COMPARISON:  None. FINDINGS: There is no evidence of fracture or dislocation. There is no evidence  of arthropathy or other focal bone abnormality. Soft tissues are unremarkable. IMPRESSION: Negative. No osseous fracture or dislocation. No foreign body appreciated within the soft tissues. Electronically Signed   By: Bary Richard M.D.   On: 08/17/2017 14:44  Procedures Procedures (including critical care time)  Medications Ordered in UC Medications - No data to display   Initial Impression / Assessment and Plan / UC Course  I have reviewed the triage vital signs and the nursing notes.  Pertinent labs & imaging results that were available during my care of the patient were reviewed by me and considered in my medical decision making (see chart for details).     Plan: 1. Test/x-ray results and diagnosis reviewed with patient 2. rx as per orders; risks, benefits, potential side effects reviewed with patient 3. Recommend supportive treatment with elevation of the hand above the heart most the weekend. Exercise hand to prevent stiffness of her joints. They should take all the Augmentin. Use Bactroban ointment until the puncture wound has healed. If you have any signs or symptoms of infection which we discussed return to the clinic immediately. 4. F/u prn if symptoms worsen or don't improve   Final Clinical Impressions(s) / UC Diagnoses   Final diagnoses:  Dog bite of finger, initial encounter    New Prescriptions New Prescriptions   AMOXICILLIN-CLAVULANATE (AUGMENTIN) 875-125 MG TABLET    Take 1 tablet by mouth every 12 (twelve) hours.   MUPIROCIN OINTMENT (BACTROBAN) 2 %    Apply 1 application topically 3 (three) times daily.     Controlled Substance Prescriptions [place] Controlled Substance Registry consulted? Not Applicable   Lutricia Feil, PA-C 08/17/17 1504

## 2017-08-17 NOTE — ED Triage Notes (Signed)
Patient states she got bitten by a dog on her pointer finger at approximately 3am.  Bite [facility] been [facility].

## 2017-08-17 NOTE — Discharge Instructions (Signed)
Elevate hand above your heart most of the time this weekend. Use hand purposefully and exercise index finger to prevent joint stiffness. This was demonstrated to you. Wash hand 3 times a day and apply Bactroban ointment ;cover the wound with a dressing. If you have increased drainage ,redness, swelling or pain return to the clinic immediately or go to the emergency room.

## 2017-11-18 IMAGING — DX DG HAND COMPLETE 3+V*R*
3 series · 3 of 3 positions shown · non-contrast
Comparison: None.

CLINICAL DATA: 30-year-old female with cat bite to the right hand.
Evaluate for foreign object.

EXAM:
RIGHT HAND - COMPLETE 3+ VIEW

[hand ap]
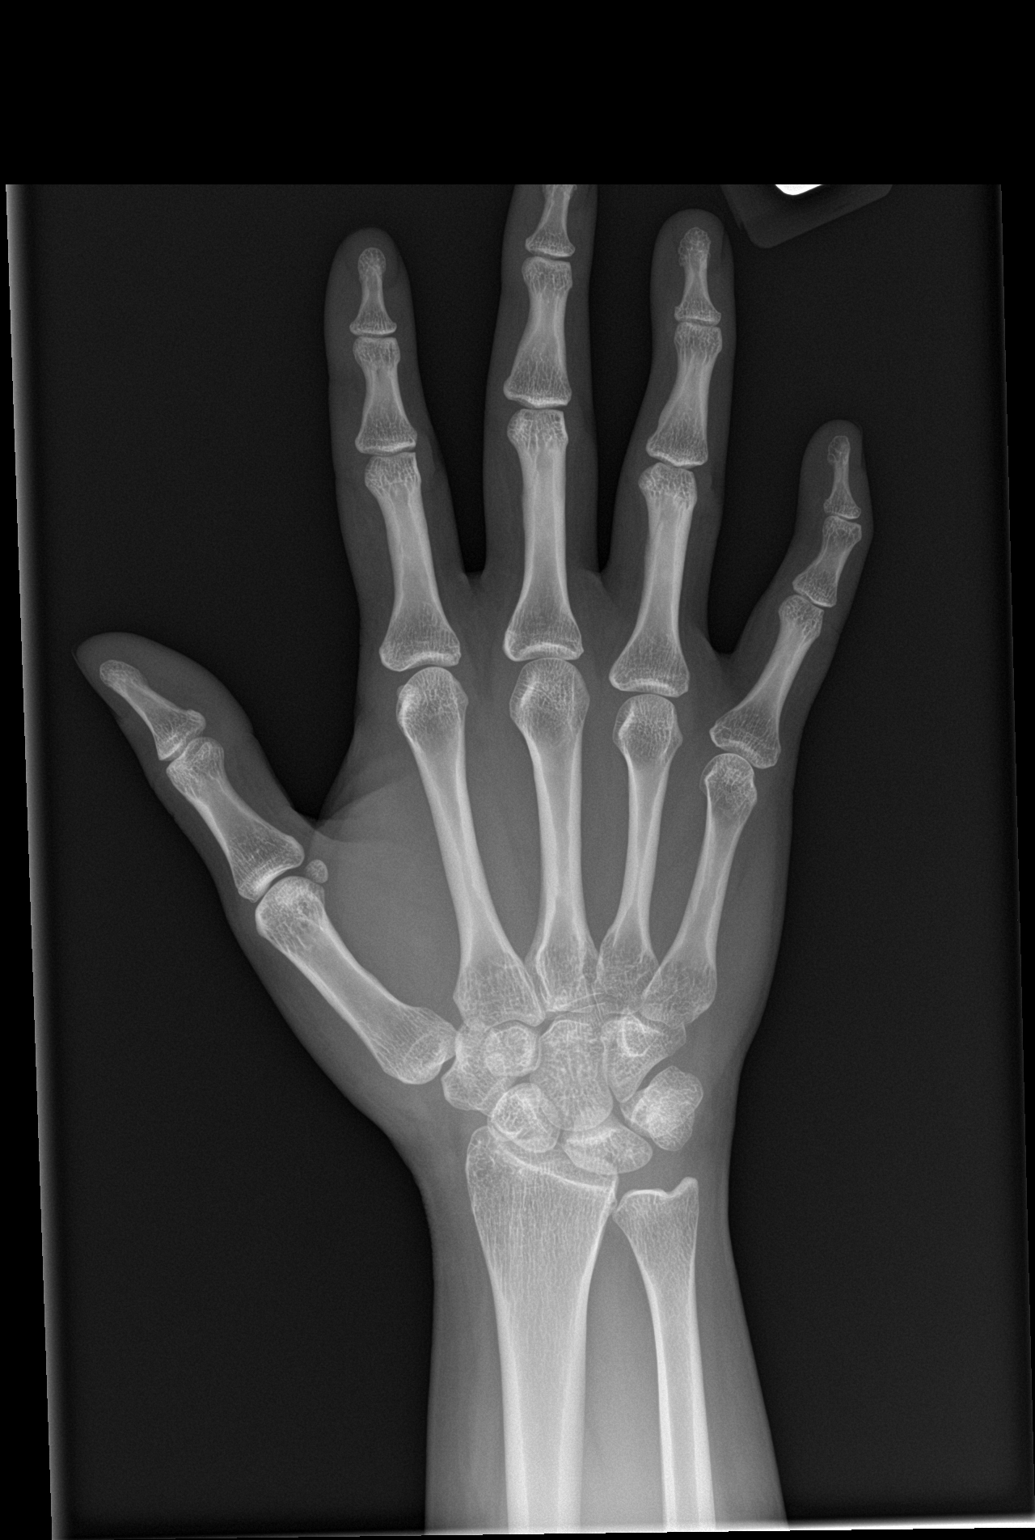

[hand obl]
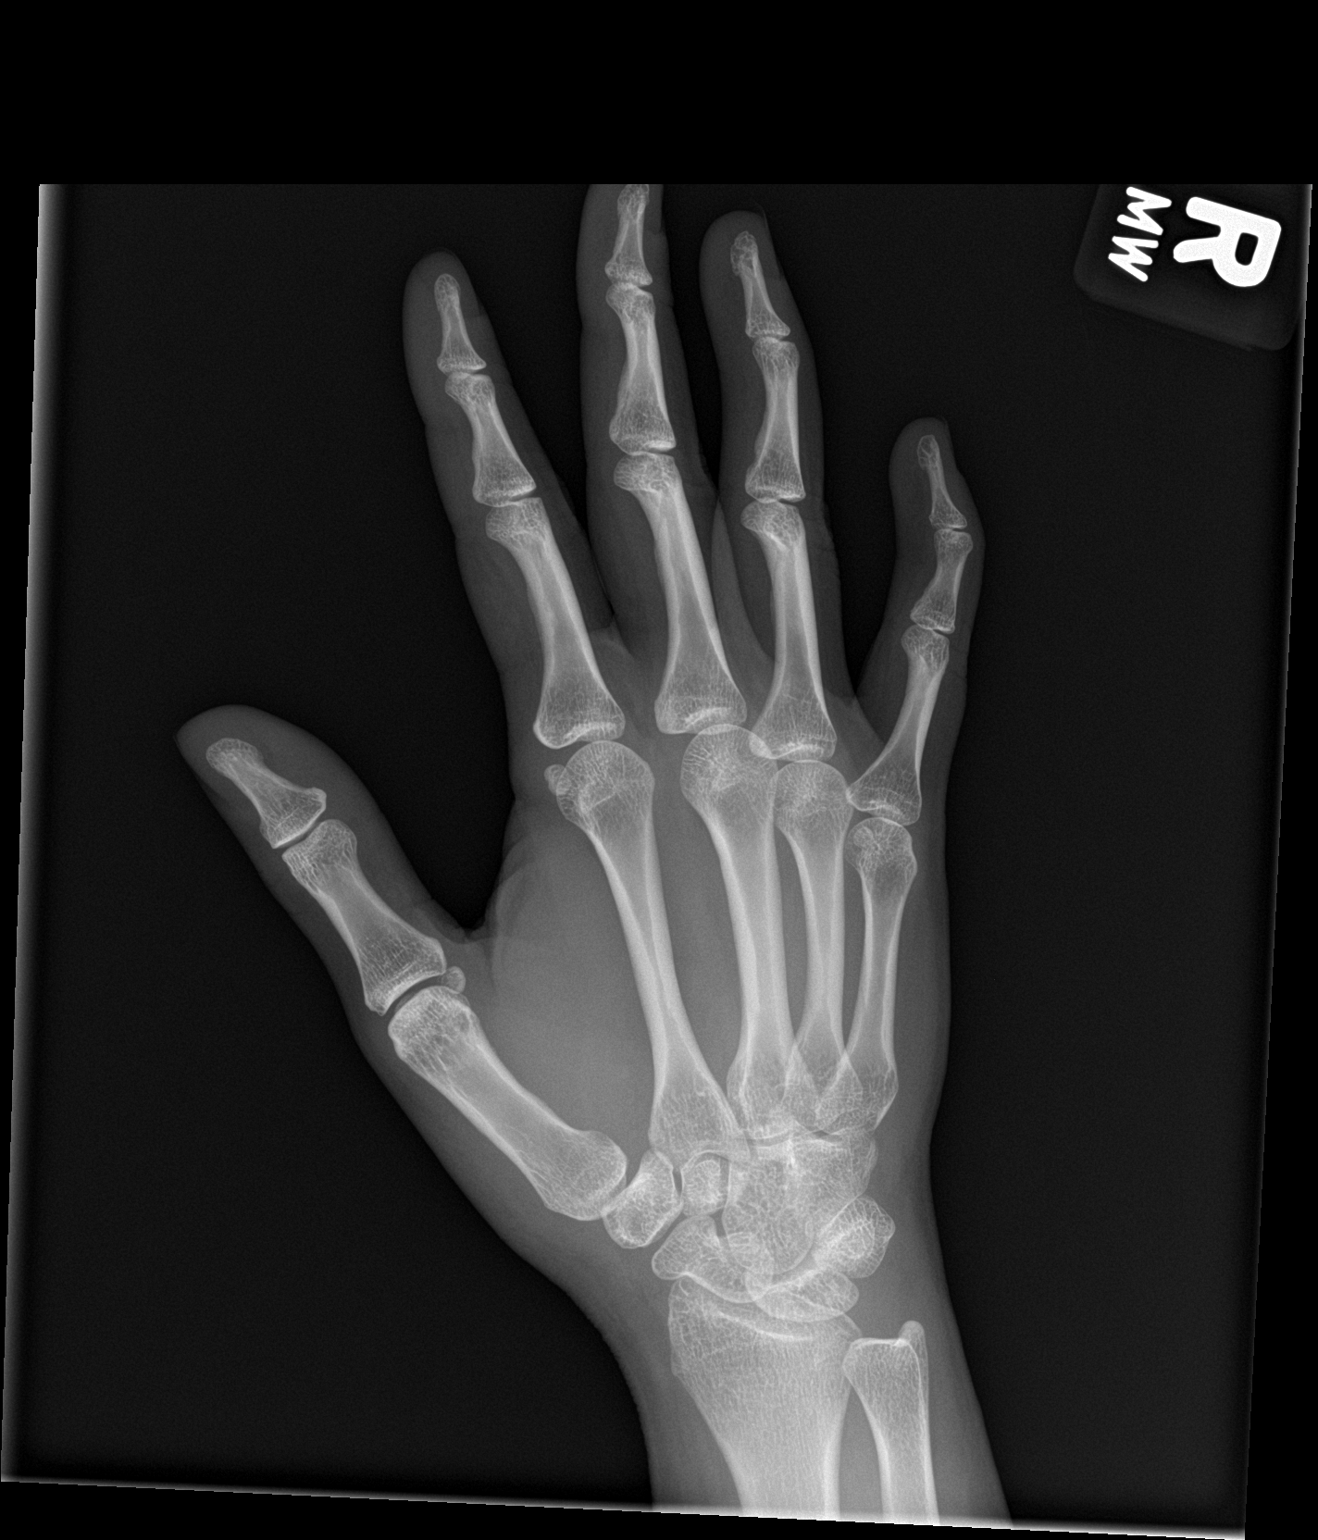

[hand lat]
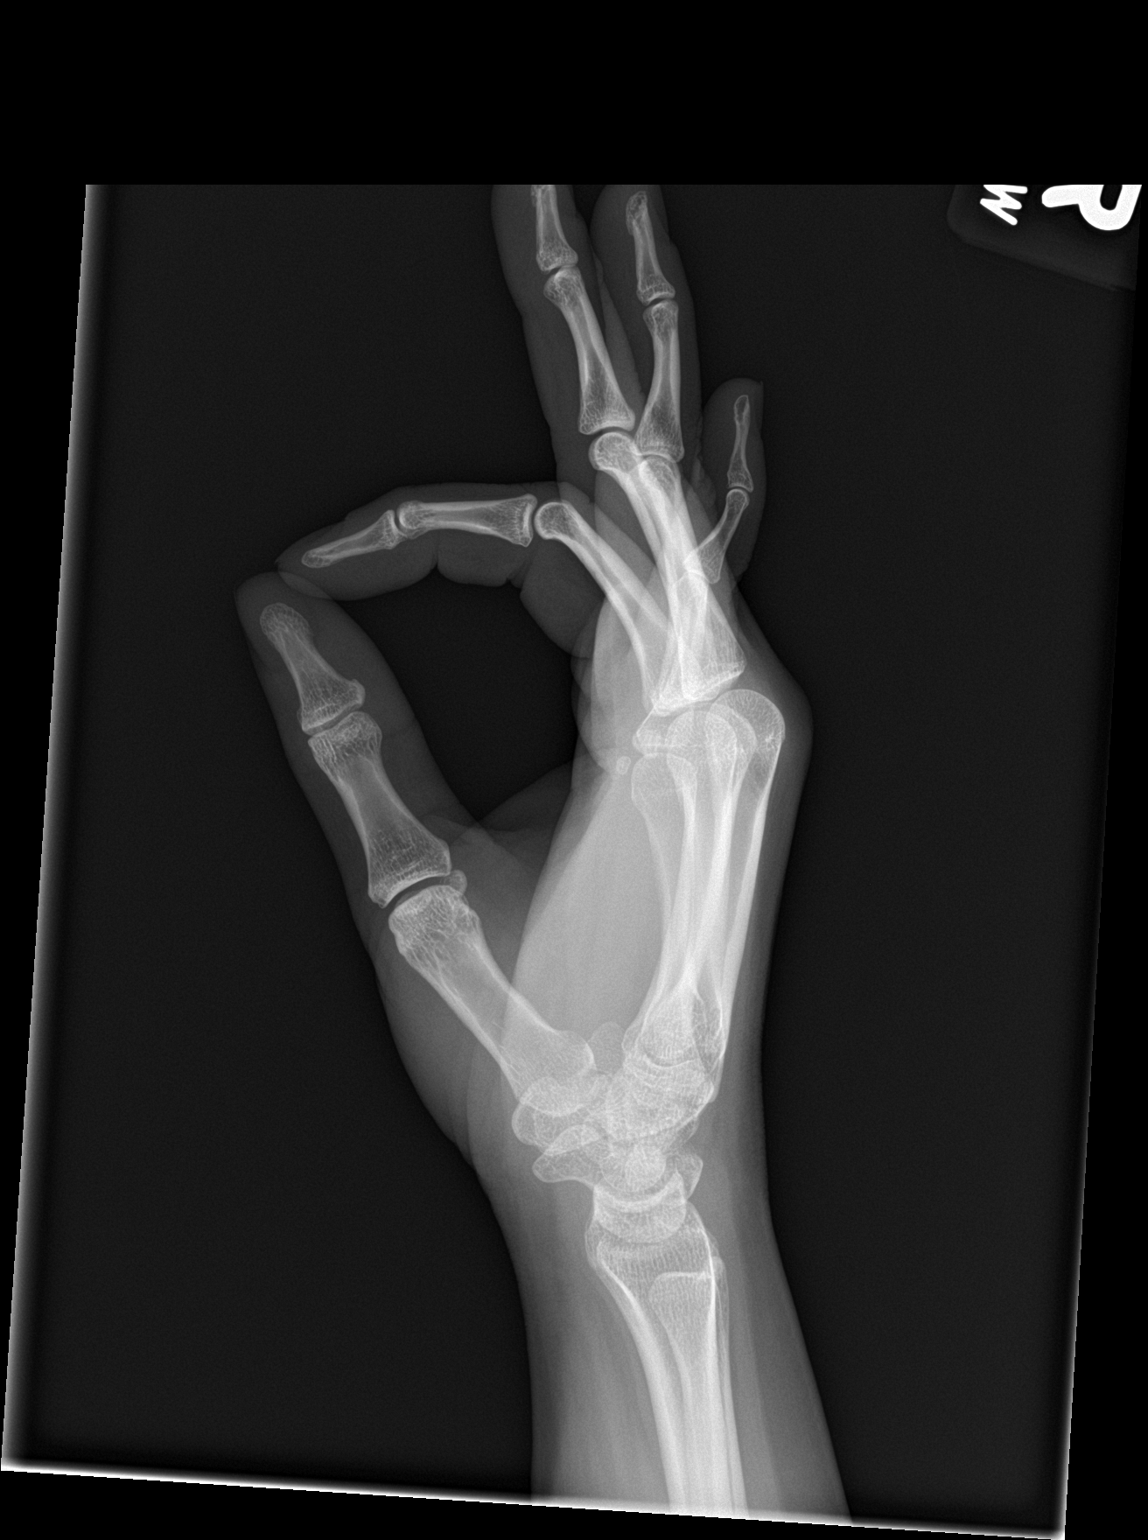

[3 of 3 positions shown; findings below may reference images not displayed]

FINDINGS: There is no acute fracture or dislocation. The bones are well
mineralized. No arthritic changes. The soft tissues appear
unremarkable. No radiopaque foreign object or soft tissue gas.
IMPRESSION: Negative.

## 2018-04-01 IMAGING — CR DG FINGER INDEX 2+V*L*
3 series · 3 of 3 positions shown · non-contrast
Comparison: None.

CLINICAL DATA: Pt with left 2nd finger/index of the left hand pian
after dog bit early this morning.

EXAM:
LEFT INDEX FINGER 2+V

[finger ap]
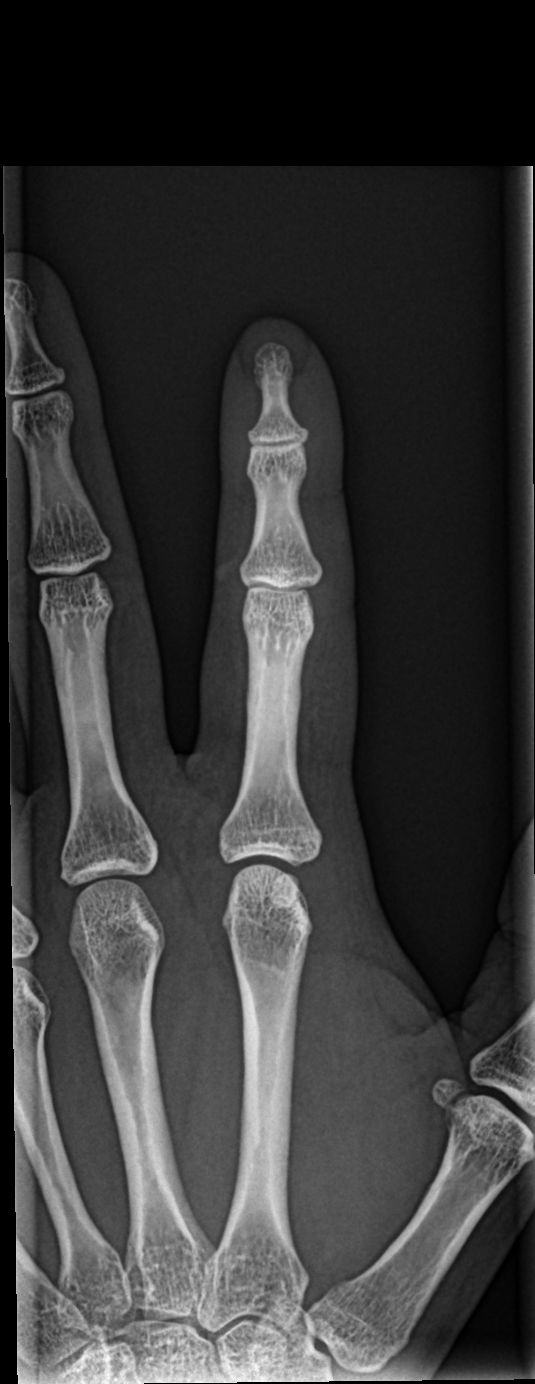

[finger obl]
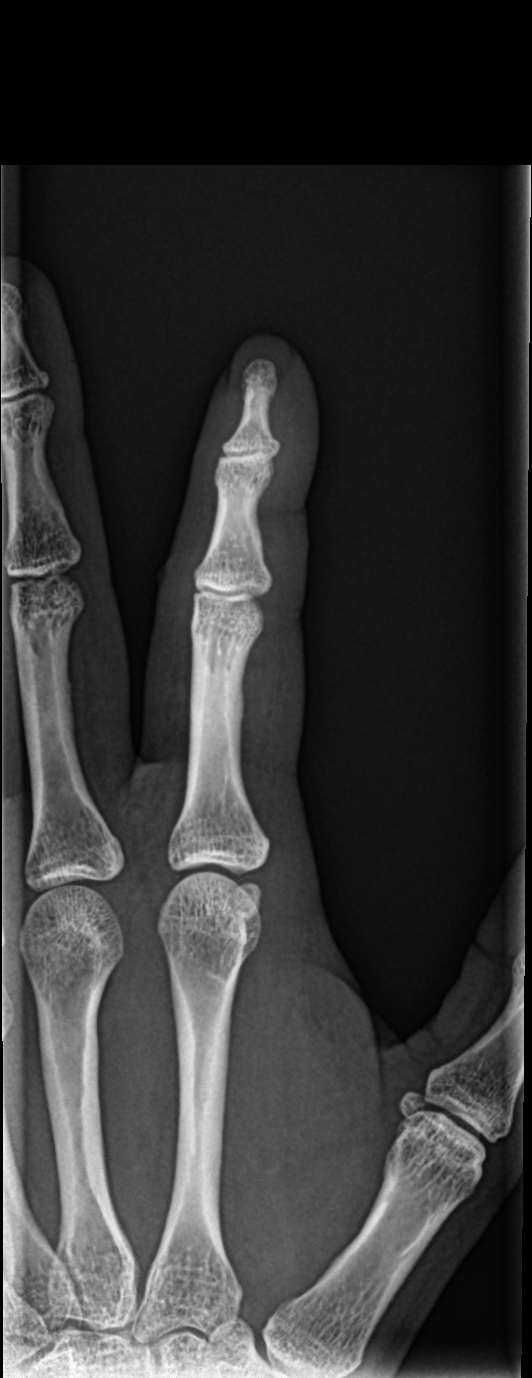

[finger lat]
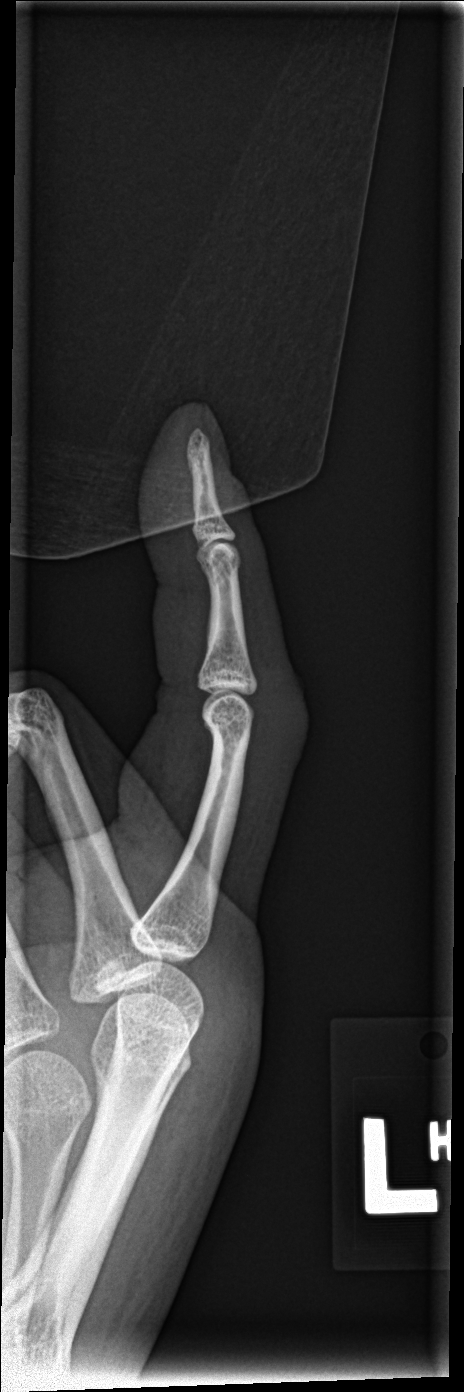

[3 of 3 positions shown; findings below may reference images not displayed]

FINDINGS: There is no evidence of fracture or dislocation. There is no
evidence of arthropathy or other focal bone abnormality. Soft
tissues are unremarkable.
IMPRESSION: Negative. No osseous fracture or dislocation. No foreign body
appreciated within the soft tissues.

## 2018-12-31 ENCOUNTER — Encounter: Payer: Self-pay | Admitting: Emergency Medicine

## 2018-12-31 ENCOUNTER — Ambulatory Visit
Admission: EM | Admit: 2018-12-31 | Discharge: 2018-12-31 | Disposition: A | Discharge status: Home / Self Care | Payer: BLUE CROSS/BLUE SHIELD | Attending: Family Medicine | Admitting: Family Medicine

## 2018-12-31 ENCOUNTER — Other Ambulatory Visit: Payer: Self-pay

## 2018-12-31 DIAGNOSIS — R0981 Nasal congestion: Secondary | ICD-10-CM | POA: Diagnosis not present

## 2018-12-31 DIAGNOSIS — R69 Illness, unspecified: Secondary | ICD-10-CM

## 2018-12-31 DIAGNOSIS — J111 Influenza due to unidentified influenza virus with other respiratory manifestations: Secondary | ICD-10-CM

## 2018-12-31 DIAGNOSIS — R05 Cough: Secondary | ICD-10-CM

## 2018-12-31 LAB — RAPID STREP SCREEN (MED CTR MEBANE ONLY): Streptococcus, Group A Screen (Direct): NEGATIVE

## 2018-12-31 LAB — RAPID INFLUENZA A&B ANTIGENS (ARMC ONLY): INFLUENZA A (ARMC): NEGATIVE

## 2018-12-31 LAB — RAPID INFLUENZA A&B ANTIGENS: Influenza B (ARMC): NEGATIVE

## 2018-12-31 MED ORDER — OSELTAMIVIR PHOSPHATE 75 MG PO CAPS: 75.0000 mg | ORAL_CAPSULE | Freq: Two times a day (BID) | ORAL | 0 refills | Status: DC

## 2018-12-31 NOTE — ED Triage Notes (Signed)
Patient c/o sore throat, fever, headache and bodyaches that started last night.

## 2018-12-31 NOTE — ED Provider Notes (Signed)
[facility] ____________________________________________  Time seen: Approximately 8:50 am   I have reviewed the triage vital signs and the nursing notes.   HISTORY  Chief Complaint Sore Throat; Headache; and Fever   HPI Brooke [name] is a 33 y.o. female in for evaluation of runny nose, nasal congestion, sore throat, chills, body aches and cough that started yesterday.  Reports abrupt onset.  States head to toe body aches.  States T-max 101.9 yesterday.  Has been taken intermittent over-the-counter DayQuil and ibuprofen which helps some.  Has continue to drink plenty of fluids, decreased appetite.  Denies chest pain, shortness of breath, abdominal pain.  Reports sick contact at work with similar.  Denies other aggravating alleviating factors.  Was otherwise doing well.  Denies recent sickness.  Denies pregnancy.Patient's last menstrual period was 12/30/2018 (exact date).  Cyndie MullSantayana, Gloria Patricia, DO: PCP    Past Medical History:  Diagnosis Date  . Anxiety   . Migraines     Patient Active Problem List   Diagnosis Date Noted  . ADHD (attention deficit hyperactivity disorder) 07/26/2016  . Anxiety 07/26/2016  . Rhinitis, allergic 04/07/2015  . Migraines 03/03/2015  . Hx of vertigo 10/06/2014  . Depression 06/17/2014  . Major depressive disorder, single episode 06/17/2014  . Acne 03/19/2014    Past Surgical History:  Procedure Laterality Date  . AXILLARY LYMPH NODE BIOPSY Right    benign  . DILATION AND EVACUATION N/A 08/10/2016   Procedure: DILATATION AND EVACUATION;  Surgeon: Herold HarmsMartin A Defrancesco, MD;  Location: [facility] OR[facility];  Service: Gynecology;  Laterality: N/A;  . KNEE SURGERY Right    repaired  . TONSILLECTOMY       No current facility-administered medications for this encounter.   Current Outpatient Medications:  .  ibuprofen (ADVIL,MOTRIN) 800 MG tablet, Take 1 tablet (800 mg total) by mouth 3 (three) times daily., Disp: 30 tablet, Rfl:  1 .  Milk Thistle 1000 MG CAPS, Take by mouth., Disp: , Rfl:  .  oseltamivir (TAMIFLU) 75 MG capsule, Take 1 capsule (75 mg total) by mouth every 12 (twelve) hours., Disp: 10 capsule, Rfl: 0 .  Prenatal Vit-Fe Fumarate-FA (MULTIVITAMIN-PRENATAL) 27-0.8 MG TABS tablet, Take 1 tablet by mouth daily at 12 noon., Disp: , Rfl:   Allergies Hydrocodone  Family History  Problem Relation Age of Onset  . Hypertension Mother   . Migraines Mother   . Diabetes Maternal Grandmother   . Osteoarthritis Maternal Grandmother   . Throat cancer Maternal Grandmother   . Cancer Maternal Grandmother   . Cancer Maternal Grandfather     Social History Social History   Tobacco Use  . Smoking status: Never Smoker  . Smokeless tobacco: Never Used  Substance Use Topics  . Alcohol use: Yes    Comment: before pregnancy  . Drug use: No    Review of Systems Constitutional: Positive fever ENT: Positive sore throat. Cardiovascular: Denies chest pain. Respiratory: Denies shortness of breath. Gastrointestinal: No abdominal pain.   Genitourinary: Negative for dysuria. Skin: Negative for rash.   ____________________________________________   PHYSICAL EXAM:  VITAL SIGNS: ED Triage Vitals  Enc Vitals Group     BP 12/31/18 0825 (!) 148/98     Pulse Rate 12/31/18 0825 93     Resp 12/31/18 0825 16     Temp 12/31/18 0825 98 F (36.7 C)     Temp Source 12/31/18 0825 Oral     SpO2 12/31/18 0825 100 %     Weight 12/31/18 0822 175  lb (79.4 kg)     Height 12/31/18 0822 5\' 3"  (1.6 m)     Head Circumference --      Peak Flow --      Pain Score 12/31/18 0822 7     Pain Loc --      Pain Edu? --      Excl. in GC? --     Constitutional: Alert and oriented. Well appearing and in no acute distress. Eyes: Conjunctivae are normal. Head: Atraumatic. No sinus tenderness to palpation. No swelling. No erythema.  Ears: no erythema, normal TMs bilaterally.   Nose:Nasal congestion with clear  rhinorrhea  Mouth/Throat: Mucous membranes are moist. Mild pharyngeal erythema. No tonsillar swelling or exudate.  Neck: No stridor.  No cervical spine tenderness to palpation. Hematological/Lymphatic/Immunilogical: No cervical lymphadenopathy. Cardiovascular: Normal rate, regular rhythm. Grossly normal heart sounds.  Good peripheral circulation. Respiratory: Normal respiratory effort.  No retractions. No wheezes, rales or rhonchi. Good air movement.  Gastrointestinal: Soft and nontender.  Musculoskeletal: Ambulatory with steady gait. No cervical, thoracic or lumbar tenderness to palpation. Neurologic:  Normal speech and language. No gait instability. Skin:  Skin appears warm, dry and intact. No rash noted. Psychiatric: Mood and affect are normal. Speech and behavior are normal. ___________________________________________   LABS (all labs ordered are listed, but only abnormal results are displayed)  Labs Reviewed  RAPID INFLUENZA A&B ANTIGENS ([facility] ONLY)  RAPID STREP SCREEN ([facility] ONLY)  CULTURE, GROUP A STREP [facility])   ____________________________________________   PROCEDURES Procedures    INITIAL IMPRESSION / ASSESSMENT AND PLAN / ED COURSE  Pertinent labs & imaging results that were available during my care of the patient were reviewed by me and considered in my medical decision making (see chart for details).  Well-appearing patient.  No acute distress.  Quick strep negative, will culture.  Influenza also negative.  However suspect influenza-like illness.  Discussed treatment options with patient.  Will treat with Tamiflu.  Encourage rest, fluids, supportive care, over-the-counter Tylenol and ibuprofen.  Work note given.Discussed indication, risks and benefits of medications with patient.  Discussed follow up with Primary care physician this week. Discussed follow up and return parameters including no resolution or any worsening concerns. Patient verbalized  understanding and agreed to plan.   ____________________________________________   FINAL CLINICAL IMPRESSION(S) / ED DIAGNOSES  Final diagnoses:  Influenza-like illness     ED Discharge Orders         Ordered    oseltamivir (TAMIFLU) 75 MG capsule  Every 12 hours     12/31/18 0909           Note: This dictation was prepared with Dragon dictation along with smaller phrase technology. Any transcriptional errors that result from this process are unintentional.         Renford DillsMiller, Sumner Kirchman, NP 12/31/18 1251

## 2018-12-31 NOTE — Discharge Instructions (Signed)
Take medication as prescribed. Rest. Drink plenty of fluids. Over the counter tylenol and ibuprofen as discussed.  ° °Follow up with your primary care physician this week as needed. Return to Urgent care for new or worsening concerns.  ° °

## 2019-01-03 LAB — CULTURE, GROUP A STREP (THRC)

## 2019-09-09 DIAGNOSIS — R87619 Unspecified abnormal cytological findings in specimens from cervix uteri: Secondary | ICD-10-CM | POA: Insufficient documentation

## 2019-09-09 DIAGNOSIS — D649 Anemia, unspecified: Secondary | ICD-10-CM | POA: Insufficient documentation

## 2019-10-16 ENCOUNTER — Other Ambulatory Visit: Payer: Self-pay

## 2019-10-16 DIAGNOSIS — Z20822 Contact with and (suspected) exposure to covid-19: Secondary | ICD-10-CM

## 2019-10-18 LAB — NOVEL CORONAVIRUS, NAA: SARS-CoV-2, NAA: NOT DETECTED

## 2019-11-26 ENCOUNTER — Ambulatory Visit: Payer: BC Managed Care – PPO | Attending: Internal Medicine

## 2019-11-26 ENCOUNTER — Other Ambulatory Visit: Payer: Self-pay | Admitting: Cardiology

## 2019-11-26 ENCOUNTER — Other Ambulatory Visit: Payer: BLUE CROSS/BLUE SHIELD

## 2019-11-26 DIAGNOSIS — Z20822 Contact with and (suspected) exposure to covid-19: Secondary | ICD-10-CM

## 2019-11-28 LAB — NOVEL CORONAVIRUS, NAA: SARS-CoV-2, NAA: NOT DETECTED

## 2019-12-01 ENCOUNTER — Ambulatory Visit
Admission: EM | Admit: 2019-12-01 | Discharge: 2019-12-01 | Disposition: A | Discharge status: Home / Self Care | Payer: BC Managed Care – PPO

## 2019-12-01 DIAGNOSIS — Z20828 Contact with and (suspected) exposure to other viral communicable diseases: Secondary | ICD-10-CM | POA: Diagnosis not present

## 2019-12-01 DIAGNOSIS — J069 Acute upper respiratory infection, unspecified: Secondary | ICD-10-CM

## 2019-12-01 DIAGNOSIS — Z20822 Contact with and (suspected) exposure to covid-19: Secondary | ICD-10-CM

## 2019-12-01 DIAGNOSIS — J4521 Mild intermittent asthma with (acute) exacerbation: Secondary | ICD-10-CM | POA: Diagnosis not present

## 2019-12-01 HISTORY — DX: Unspecified asthma, uncomplicated: J45.909

## 2019-12-01 MED ORDER — AZELASTINE HCL 0.1 % NA SOLN: 2.0000 | Freq: Two times a day (BID) | NASAL | 0 refills | Status: DC

## 2019-12-01 MED ORDER — DEXAMETHASONE SODIUM PHOSPHATE 10 MG/ML IJ SOLN
10.0000 mg | Freq: Once | INTRAMUSCULAR | Status: AC
  Administered 2019-12-01: 10:00:00 10 mg via INTRAMUSCULAR

## 2019-12-01 MED ORDER — METHYLPREDNISOLONE 4 MG PO TBPK: ORAL_TABLET | ORAL | 0 refills | Status: DC

## 2019-12-01 NOTE — ED Provider Notes (Signed)
[facility]    CSN: [id number] Arrival date & time: 12/01/19  [id number]      History   Chief Complaint Chief Complaint  Patient presents with  . Shortness of Breath    HPI Brooke [name] is a 33 y.o. female.   33 year old female comes in for 9 day history of URI symptoms. Started having headache, shortness of breath, fatigue. Had rapid COVID 11/24/2019, which was negative, and was given azithromycin. PCR testing 11/26/2019 with negative COVID. Intermittent throat irritation, cough, nasal congestion. Denies fever, chills, body aches. Denies abdominal pain, nausea, vomiting, diarrhea. Chest tightness with feeling of not being able to take a deep breath with some relief with albuterol. Denies loss of taste/smell. flonase daily without much relief.  Multiple positive Covid exposure, with last exposure 11/21/2019.     Past Medical History:  Diagnosis Date  . Anxiety   . Asthma   . Migraines     Patient Active Problem List   Diagnosis Date Noted  . ADHD (attention deficit hyperactivity disorder) 07/26/2016  . Anxiety 07/26/2016  . Rhinitis, allergic 04/07/2015  . Migraines 03/03/2015  . Hx of vertigo 10/06/2014  . Depression 06/17/2014  . Major depressive disorder, single episode 06/17/2014  . Acne 03/19/2014    Past Surgical History:  Procedure Laterality Date  . AXILLARY LYMPH NODE BIOPSY Right    benign  . DILATION AND EVACUATION N/A 08/10/2016   Procedure: DILATATION AND EVACUATION;  Surgeon: Herold HarmsMartin A Defrancesco, MD;  Location: [facility] OR[facility];  Service: Gynecology;  Laterality: N/A;  . KNEE SURGERY Right    repaired  . TONSILLECTOMY      OB History    Gravida  2   Para      Term      Preterm      AB  1   Living        SAB  1   TAB      Ectopic      Multiple      Live Births               Home Medications    Prior to Admission medications   Medication Sig Start Date End Date Taking? Authorizing Provider  cetirizine  (ZYRTEC) 10 MG tablet Take 10 mg by mouth daily.   Yes [provider]  fluticasone (FLONASE) 50 MCG/ACT nasal spray Place into both nostrils daily.   Yes [provider]  lisdexamfetamine (VYVANSE) 40 MG capsule Take 40 mg by mouth every morning.   Yes [provider]  azelastine (ASTELIN) 0.1 % nasal spray Place 2 sprays into both nostrils 2 (two) times daily. 12/01/19   Cathie HoopsYu, Taesean Reth V, PA-C  ibuprofen (ADVIL,MOTRIN) 800 MG tablet Take 1 tablet (800 mg total) by mouth 3 (three) times daily. 08/10/16   Defrancesco, Prentice DockerMartin A, MD  methylPREDNISolone (MEDROL DOSEPAK) 4 MG TBPK tablet Follow pack direction 12/01/19   Belinda FisherYu, Fransheska Willingham V, PA-C    Family History Family History  Problem Relation Age of Onset  . Hypertension Mother   . Migraines Mother   . Diabetes Maternal Grandmother   . Osteoarthritis Maternal Grandmother   . Throat cancer Maternal Grandmother   . Cancer Maternal Grandmother   . Cancer Maternal Grandfather     Social History Social History   Tobacco Use  . Smoking status: Never Smoker  . Smokeless tobacco: Never Used  Substance Use Topics  . Alcohol use: Yes    Comment: before pregnancy  .  Drug use: No     Allergies   Hydrocodone   Review of Systems Review of Systems  Reason unable to perform ROS: See HPI as above.     Physical Exam Triage Vital Signs ED Triage Vitals  Enc Vitals Group     BP 12/01/19 0911 (!) 144/89     Pulse Rate 12/01/19 0911 93     Resp 12/01/19 0911 16     Temp 12/01/19 0911 98.2 F (36.8 C)     Temp Source 12/01/19 0911 Oral     SpO2 12/01/19 0911 98 %     Weight --      Height --      Head Circumference --      Peak Flow --      Pain Score 12/01/19 0919 5     Pain Loc --      Pain Edu? --      Excl. in GC? --    No data found.  Updated Vital Signs BP (!) 144/89 (BP Location: Left Arm)   Pulse 93   Temp 98.2 F (36.8 C) (Oral)   Resp 16   LMP 11/25/2019   SpO2 98%   Physical Exam Constitutional:       General: She is not in acute distress.    Appearance: Normal appearance. She is not ill-appearing, toxic-appearing or diaphoretic.  HENT:     Head: Normocephalic and atraumatic.     Mouth/Throat:     Mouth: Mucous membranes are moist.     Pharynx: Oropharynx is clear. Uvula midline.  Cardiovascular:     Rate and Rhythm: Normal rate and regular rhythm.     Heart sounds: Normal heart sounds. No murmur. No friction rub. No gallop.   Pulmonary:     Effort: Pulmonary effort is normal. No accessory muscle usage, prolonged expiration, respiratory distress or retractions.     Comments: Speaking in full sentences without difficulty.  Lungs clear to auscultation without adventitious lung sounds. Musculoskeletal:     Cervical back: Normal range of motion and neck supple.  Skin:    General: Skin is warm and dry.  Neurological:     General: No focal deficit present.     Mental Status: She is alert and oriented to person, place, and time.      UC Treatments / Results  Labs (all labs ordered are listed, but only abnormal results are displayed) Labs Reviewed - No data to display  EKG   Radiology No results found.  Procedures Procedures (including critical care time)  Medications Ordered in UC Medications  dexamethasone (DECADRON) injection 10 mg (10 mg Intramuscular Given 12/01/19 1008)    Initial Impression / Assessment and Plan / UC Course  I have reviewed the triage vital signs and the nursing notes.  Pertinent labs & imaging results that were available during my care of the patient were reviewed by me and considered in my medical decision making (see chart for details).    Given Covid testing negative with the worsening of symptoms, at this time will not retest.  Patient to continue to monitor, to contact office if worsening symptoms to assess for retesting if needed.  Patient with asthma exacerbation, lungs are clear to auscultation bilaterally without adventitious lung  sounds.  She has history of side effects with oral prednisone, but has tolerated IM steroids in the past.  Will provide Decadron injection in office today.  Will have patient continue albuterol and other symptomatic treatment.  Medrol pack  also called into pharmacy, can start if symptoms do not improving.  Return precautions given.  Patient expresses understanding and agrees to plan.  Final Clinical Impressions(s) / UC Diagnoses   Final diagnoses:  Viral URI  Exposure to COVID-19 virus  Mild intermittent asthma with acute exacerbation   ED Prescriptions    Medication Sig Dispense Auth. Provider   azelastine (ASTELIN) 0.1 % nasal spray Place 2 sprays into both nostrils 2 (two) times daily. 30 mL Maleeya Peterkin V, PA-C   methylPREDNISolone (MEDROL DOSEPAK) 4 MG TBPK tablet Follow pack direction 21 tablet Ok Edwards, PA-C     PDMP not reviewed this encounter.   Ok Edwards, PA-C 12/01/19 1958

## 2019-12-01 NOTE — Discharge Instructions (Signed)
No alarming signs on exam.  As discussed, given recent Covid testing negative, no worsening of symptoms, did not need retesting.  However, if you develop worsening symptoms such as fever, body aches, worsening shortness of breath, loss of taste or smell, may need retesting.  Decadron injection in office today.  Continue Flonase, and add on azelastine nasal spray.  Continue albuterol as needed for shortness of breath and wheezing.  If symptoms not improving with Decadron injection, please start Medrol pack as directed.  Go to the emergency department if you are unable to speak in full sentences, or unable to walk long distances.

## 2019-12-01 NOTE — ED Triage Notes (Signed)
Pt c/o SOB with chest tightness and pain on inspiration. States sx's started 9 days ago, was given a z-pack and inhaler a week ago with no relief.

## 2019-12-01 NOTE — ED Notes (Signed)
States had 2 neg. COVID test last Monday and wednesday

## 2019-12-10 DIAGNOSIS — J45909 Unspecified asthma, uncomplicated: Secondary | ICD-10-CM | POA: Insufficient documentation

## 2020-03-08 DIAGNOSIS — F5104 Psychophysiologic insomnia: Secondary | ICD-10-CM | POA: Insufficient documentation

## 2020-06-15 DIAGNOSIS — F3341 Major depressive disorder, recurrent, in partial remission: Secondary | ICD-10-CM | POA: Insufficient documentation

## 2020-07-23 DIAGNOSIS — R233 Spontaneous ecchymoses: Secondary | ICD-10-CM | POA: Insufficient documentation

## 2020-07-24 DIAGNOSIS — D691 Qualitative platelet defects: Secondary | ICD-10-CM | POA: Insufficient documentation

## 2020-12-31 DIAGNOSIS — M5412 Radiculopathy, cervical region: Secondary | ICD-10-CM | POA: Insufficient documentation

## 2021-09-03 DIAGNOSIS — F3170 Bipolar disorder, currently in remission, most recent episode unspecified: Secondary | ICD-10-CM | POA: Insufficient documentation

## 2021-09-03 DIAGNOSIS — R030 Elevated blood-pressure reading, without diagnosis of hypertension: Secondary | ICD-10-CM | POA: Insufficient documentation

## 2021-10-04 DIAGNOSIS — Z8659 Personal history of other mental and behavioral disorders: Secondary | ICD-10-CM | POA: Insufficient documentation

## 2021-11-29 DIAGNOSIS — M12261 Villonodular synovitis (pigmented), right knee: Secondary | ICD-10-CM | POA: Insufficient documentation

## 2022-01-11 DIAGNOSIS — M25561 Pain in right knee: Secondary | ICD-10-CM | POA: Insufficient documentation

## 2022-10-25 ENCOUNTER — Ambulatory Visit: Payer: BC Managed Care – PPO | Admitting: Nurse Practitioner

## 2022-10-25 ENCOUNTER — Encounter: Payer: Self-pay | Admitting: Nurse Practitioner

## 2022-10-25 VITALS — BP 137/87 | HR 90 | Ht 63.0 in | Wt 174.4 lb

## 2022-10-25 DIAGNOSIS — Z7689 Persons encountering health services in other specified circumstances: Secondary | ICD-10-CM | POA: Diagnosis not present

## 2022-10-25 DIAGNOSIS — G2581 Restless legs syndrome: Secondary | ICD-10-CM | POA: Diagnosis not present

## 2022-10-25 DIAGNOSIS — F411 Generalized anxiety disorder: Secondary | ICD-10-CM | POA: Diagnosis not present

## 2022-10-25 MED ORDER — DULOXETINE HCL 30 MG PO CPEP: 30.0000 mg | ORAL_CAPSULE | Freq: Two times a day (BID) | ORAL | 2 refills | Status: DC

## 2022-10-25 MED ORDER — TOPIRAMATE 50 MG PO TABS: 50.0000 mg | ORAL_TABLET | Freq: Every day | ORAL | 2 refills | Status: DC

## 2022-10-25 MED ORDER — GABAPENTIN 100 MG PO CAPS: 100.0000 mg | ORAL_CAPSULE | Freq: Every day | ORAL | 2 refills | Status: DC

## 2022-10-25 MED ORDER — HYDROXYZINE PAMOATE 25 MG PO CAPS: 25.0000 mg | ORAL_CAPSULE | Freq: Every day | ORAL | 2 refills | Status: DC | PRN

## 2022-10-25 NOTE — Progress Notes (Signed)
New Patient Office Visit  Subjective    Patient ID: Brooke [name], female    DOB: [birth year]  Age: 36 y.o. MRN: [id number]  CC:  Chief Complaint  Patient presents with   New Patient (Initial Visit)    HPI Brooke [name] presents to establish care -Had been seeing provider from [facility] in [facility].  -has not seen GYN provider in some time.  -overdue for pap  -due to have routine, fasting labs and pap smear  -has history of depression, anxiety, and ADD --did see psychiatry at one point which changed around medications making her feel very fatigued.  --was on topamax at one point. Did help with anxiety and sleep. Has been out for some time.  --takes gabapentin due to "jumpy" legs and difficulty sleeping.  -was on Vyvanse for ADHD.   Outpatient Encounter Medications as of 10/25/2022  Medication Sig   fluticasone (FLONASE) 50 MCG/ACT nasal spray Place into both nostrils daily.   gabapentin (NEURONTIN) 100 MG capsule Take 1 capsule (100 mg total) by mouth at bedtime.   ibuprofen (ADVIL,MOTRIN) 800 MG tablet Take 1 tablet (800 mg total) by mouth 3 (three) times daily.   topiramate (TOPAMAX) 50 MG tablet Take 1 tablet (50 mg total) by mouth at bedtime.   [DISCONTINUED] DULoxetine (CYMBALTA) 30 MG capsule Take 1 capsule by mouth daily for one week, then increase to 1 Capsule twice a day.   [DISCONTINUED] hydrOXYzine (VISTARIL) 25 MG capsule Take by mouth.   DULoxetine (CYMBALTA) 30 MG capsule Take 1 capsule (30 mg total) by mouth 2 (two) times daily.   hydrOXYzine (VISTARIL) 25 MG capsule Take 1 capsule (25 mg total) by mouth daily as needed.   [DISCONTINUED] azelastine (ASTELIN) 0.1 % nasal spray Place 2 sprays into both nostrils 2 (two) times daily.   [DISCONTINUED] cetirizine (ZYRTEC) 10 MG tablet Take 10 mg by mouth daily.   [DISCONTINUED] lisdexamfetamine (VYVANSE) 40 MG capsule Take 40 mg by mouth every morning.   [DISCONTINUED] methylPREDNISolone (MEDROL  DOSEPAK) 4 MG TBPK tablet Follow pack direction   No facility-administered encounter medications on file as of 10/25/2022.    Past Medical History:  Diagnosis Date   Anxiety    Asthma    Migraines     Past Surgical History:  Procedure Laterality Date   AXILLARY LYMPH NODE BIOPSY Right    benign   DILATION AND EVACUATION N/A 08/10/2016   Procedure: DILATATION AND EVACUATION;  Surgeon: Herold Harms, MD;  Location: [facility] ORS;  Service: Gynecology;  Laterality: N/A;   KNEE SURGERY Right    repaired   TONSILLECTOMY      Family History  Problem Relation Age of Onset   Hypertension Mother    Migraines Mother    Diabetes Maternal Grandmother    Osteoarthritis Maternal Grandmother    Throat cancer Maternal Grandmother    Cancer Maternal Grandmother    Cancer Maternal Grandfather     Social History   Socioeconomic History   Marital status: Divorced    Spouse name: Not on file   Number of children: Not on file   Years of education: Not on file   Highest education level: Not on file  Occupational History   Not on file  Tobacco Use   Smoking status: Never   Smokeless tobacco: Never  Vaping Use   Vaping Use: Never used  Substance and Sexual Activity   Alcohol use: Yes    Comment: before pregnancy   Drug use:  No   Sexual activity: Yes    Partners: Male    Birth control/protection: None  Other Topics Concern   Not on file  Social History Narrative   Not on file   Social Determinants of Health   Financial Resource Strain: Not on file  Food Insecurity: Not on file  Transportation Needs: Not on file  Physical Activity: Not on file  Stress: Not on file  Social Connections: Not on file  Intimate Partner Violence: Not on file    Review of Systems  Constitutional:  Positive for malaise/fatigue. Negative for chills and fever.  HENT:  Negative for congestion, sinus pain and sore throat.   Eyes: Negative.   Respiratory:  Negative for cough, shortness of breath  and wheezing.   Cardiovascular:  Negative for chest pain, palpitations and leg swelling.  Gastrointestinal:  Negative for constipation, diarrhea, nausea and vomiting.  Genitourinary: Negative.   Musculoskeletal:  Negative for myalgias.  Skin: Negative.   Neurological:  Positive for tremors. Negative for dizziness and headaches.  Endo/Heme/Allergies:  Does not bruise/bleed easily.  Psychiatric/Behavioral:  Positive for depression. The patient is nervous/anxious and has insomnia.         Objective    Today's Vitals   10/25/22 0940 10/25/22 0955  BP: (Abnormal) 134/91 137/87  Pulse: 90   SpO2: 100%   Weight: 174 lb 6.4 oz (79.1 kg)   Height: 5\' 3"  (1.6 m)    Body mass index is 30.89 kg/m.  Physical Exam Vitals and nursing note reviewed.  Constitutional:      Appearance: Normal appearance. She is well-developed.  HENT:     Head: Normocephalic and atraumatic.     Nose: Nose normal.     Mouth/Throat:     Mouth: Mucous membranes are moist.     Pharynx: Oropharynx is clear.  Eyes:     Extraocular Movements: Extraocular movements intact.     Conjunctiva/sclera: Conjunctivae normal.     Pupils: Pupils are equal, round, and reactive to light.  Cardiovascular:     Rate and Rhythm: Normal rate and regular rhythm.     Pulses: Normal pulses.     Heart sounds: Normal heart sounds.  Pulmonary:     Effort: Pulmonary effort is normal.     Breath sounds: Normal breath sounds.  Abdominal:     Palpations: Abdomen is soft.  Musculoskeletal:        General: Normal range of motion.     Cervical back: Normal range of motion and neck supple.  Lymphadenopathy:     Cervical: No cervical adenopathy.  Skin:    General: Skin is warm and dry.     Capillary Refill: Capillary refill takes less than 2 seconds.  Neurological:     General: No focal deficit present.     Mental Status: She is alert and oriented to person, place, and time.  Psychiatric:        Mood and Affect: Mood normal.         Behavior: Behavior normal.        Thought Content: Thought content normal.        Judgment: Judgment normal.       Assessment & Plan:  1. Generalized anxiety disorder Restart duloxetine 30 mg. Encouraged her to take daily for depression/anxiety. Increase to twice daily as tolerated. Take topamax 50 mg at bedtime to help with sleep. May take hydroxyzine 25 mg  daily as needed for acute anxiety.  - topiramate (TOPAMAX) 50 MG tablet;  Take 1 tablet (50 mg total) by mouth at bedtime.  Dispense: 30 tablet; Refill: 2 - DULoxetine (CYMBALTA) 30 MG capsule; Take 1 capsule (30 mg total) by mouth 2 (two) times daily.  Dispense: 60 capsule; Refill: 2 - hydrOXYzine (VISTARIL) 25 MG capsule; Take 1 capsule (25 mg total) by mouth daily as needed.  Dispense: 30 capsule; Refill: 2  2. Restless legs syndrome May take topamax 100 mg at bedtime.  - gabapentin (NEURONTIN) 100 MG capsule; Take 1 capsule (100 mg total) by mouth at bedtime.  Dispense: 30 capsule; Refill: 2  3. Encounter to establish care Appointment today to establish new primary care provider     Problem List Items Addressed This Visit       Other   ADHD (attention deficit hyperactivity disorder)   Generalized anxiety disorder - Primary   Relevant Medications   topiramate (TOPAMAX) 50 MG tablet   DULoxetine (CYMBALTA) 30 MG capsule   hydrOXYzine (VISTARIL) 25 MG capsule   Restless legs syndrome   Relevant Medications   gabapentin (NEURONTIN) 100 MG capsule   Other Visit Diagnoses     Encounter to establish care           Return in about 2 months (around 12/25/2022) for health maintenance exam, with pap, FBW a week prior to visit. please make 40 min.   Carlean Jews, NP

## 2022-11-10 DIAGNOSIS — G2581 Restless legs syndrome: Secondary | ICD-10-CM | POA: Insufficient documentation

## 2022-11-10 DIAGNOSIS — F411 Generalized anxiety disorder: Secondary | ICD-10-CM | POA: Insufficient documentation

## 2023-01-01 ENCOUNTER — Other Ambulatory Visit: Payer: Self-pay | Admitting: Nurse Practitioner

## 2023-01-01 DIAGNOSIS — Z0000 Encounter for general adult medical examination without abnormal findings: Secondary | ICD-10-CM

## 2023-01-20 ENCOUNTER — Other Ambulatory Visit: Payer: Self-pay | Admitting: Nurse Practitioner

## 2023-01-20 DIAGNOSIS — F411 Generalized anxiety disorder: Secondary | ICD-10-CM

## 2023-01-22 NOTE — Telephone Encounter (Signed)
L.O.V: 10/25/22  N.O.V: 01/29/23  L.R.F: 10/25/22 Topiramate 30 tab 2 refill   Refill request will be addressed at next OV.

## 2023-01-23 ENCOUNTER — Other Ambulatory Visit: Payer: BC Managed Care – PPO

## 2023-01-28 NOTE — Progress Notes (Unsigned)
Complete physical exam   Patient: Brooke [name]   DOB: [birth year]   37 y.o. Female  MRN: [id number] Visit Date: 01/29/2023    No chief complaint on file.  Subjective    Brooke [name] is a 37 y.o. female who presents today for a complete physical exam.  She reports consuming a {diet types:17450} diet. {Exercise:19826} She generally feels {well/fairly well/poorly:18703}. She {does/does not:200015} have additional problems to discuss today.   HPI  Annual physical with pap smear.  -routine, fasting labs need to be drawn. Orders already placed in epic.  -has history of anxiety, depression, and ADD.  -restless legs --takes gabapentin.    Past Surgical History:  Procedure Laterality Date   AXILLARY LYMPH NODE BIOPSY Right    benign   DILATION AND EVACUATION N/A 08/10/2016   Procedure: DILATATION AND EVACUATION;  Surgeon: Brayton Mars, MD;  Location: [facility] ORS;  Service: Gynecology;  Laterality: N/A;   KNEE SURGERY Right    repaired   TONSILLECTOMY     Social History   Socioeconomic History   Marital status: Divorced    Spouse name: Not on file   Number of children: Not on file   Years of education: Not on file   Highest education level: Not on file  Occupational History   Not on file  Tobacco Use   Smoking status: Never   Smokeless tobacco: Never  Vaping Use   Vaping Use: Never used  Substance and Sexual Activity   Alcohol use: Yes    Comment: before pregnancy   Drug use: No   Sexual activity: Yes    Partners: Male    Birth control/protection: None  Other Topics Concern   Not on file  Social History Narrative   Not on file   Social Determinants of Health   Financial Resource Strain: Not on file  Food Insecurity: Not on file  Transportation Needs: Not on file  Physical Activity: Not on file  Stress: Not on file  Social Connections: Not on file  Intimate Partner Violence: Not on file   Family Status  Relation Name Status   Mother  (Not  Specified)   MGM  (Not Specified)   MGF  (Not Specified)   Family History  Problem Relation Age of Onset   Hypertension Mother    Migraines Mother    Diabetes Maternal Grandmother    Osteoarthritis Maternal Grandmother    Throat cancer Maternal Grandmother    Cancer Maternal Grandmother    Cancer Maternal Grandfather    Allergies  Allergen Reactions   Hydrocodone Palpitations    Patient Care Team: Ronnell Freshwater, NP as PCP - General (Family Medicine)   Medications: Outpatient Medications Prior to Visit  Medication Sig   DULoxetine (CYMBALTA) 30 MG capsule Take 1 capsule (30 mg total) by mouth 2 (two) times daily.   fluticasone (FLONASE) 50 MCG/ACT nasal spray Place into both nostrils daily.   gabapentin (NEURONTIN) 100 MG capsule Take 1 capsule (100 mg total) by mouth at bedtime.   hydrOXYzine (VISTARIL) 25 MG capsule Take 1 capsule (25 mg total) by mouth daily as needed.   ibuprofen (ADVIL,MOTRIN) 800 MG tablet Take 1 tablet (800 mg total) by mouth 3 (three) times daily.   topiramate (TOPAMAX) 50 MG tablet Take 1 tablet (50 mg total) by mouth at bedtime.   No facility-administered medications prior to visit.    Review of Systems  {Labs (Optional):23779}   Objective    There were no vitals  filed for this visit. There is no height or weight on file to calculate BMI.  BP Readings from Last 3 Encounters:  10/25/22 137/87  12/01/19 (Abnormal) 144/89  12/31/18 (Abnormal) 148/98    Wt Readings from Last 3 Encounters:  10/25/22 174 lb 6.4 oz (79.1 kg)  12/31/18 175 lb (79.4 kg)  08/17/17 168 lb (76.2 kg)     Physical Exam  ***  Last depression screening scores   Row Labels 10/25/2022    9:45 AM  PHQ 2/9 Scores   Section Header. No data exists in this row.   PHQ - 2 Score   3  PHQ- 9 Score   12   Last fall risk screening   Row Labels 10/25/2022    9:46 AM  Fall Risk    Section Header. No data exists in this row.   Falls in the past year?   0  Number  falls in past yr:   0  Injury with Fall?   0  Follow up   Falls evaluation completed   Last Audit-C alcohol use screening   No data to display    A score of 3 or more in women, and 4 or more in men indicates increased risk for alcohol abuse, EXCEPT if all of the points are from question 1   No results found for any visits on 01/29/23.  Assessment & Plan    Routine Health Maintenance and Physical Exam  Exercise Activities and Dietary recommendations  Goals   None      There is no immunization history on file for this patient.  Health Maintenance  Topic Date Due   Hepatitis C Screening  Never done   DTaP/Tdap/Td (1 - Tdap) Never done   PAP SMEAR-Modifier  Never done   INFLUENZA VACCINE  07/11/2022   COVID-19 Vaccine (3 - 2023-24 season) 08/11/2022   HIV Screening  Completed   HPV VACCINES  Aged Out    Discussed health benefits of physical activity, and encouraged her to engage in regular exercise appropriate for her age and condition.  Problem List Items Addressed This Visit   None    Return in about 3 months (around 04/29/2023) for mood, med refills.        Ronnell Freshwater, NP  [facility] [phone] (phone) [phone] (fax)  [place]

## 2023-01-29 ENCOUNTER — Ambulatory Visit: Payer: BC Managed Care – PPO | Admitting: Nurse Practitioner

## 2023-01-29 ENCOUNTER — Encounter: Payer: Self-pay | Admitting: Nurse Practitioner

## 2023-01-29 VITALS — BP 141/91 | HR 86 | Ht 63.0 in | Wt 183.8 lb

## 2023-01-29 DIAGNOSIS — G2581 Restless legs syndrome: Secondary | ICD-10-CM | POA: Diagnosis not present

## 2023-01-29 DIAGNOSIS — R0981 Nasal congestion: Secondary | ICD-10-CM | POA: Diagnosis not present

## 2023-01-29 DIAGNOSIS — J101 Influenza due to other identified influenza virus with other respiratory manifestations: Secondary | ICD-10-CM | POA: Insufficient documentation

## 2023-01-29 DIAGNOSIS — F411 Generalized anxiety disorder: Secondary | ICD-10-CM | POA: Diagnosis not present

## 2023-01-29 LAB — POCT INFLUENZA A/B
Influenza A, POC: NEGATIVE
Influenza B, POC: POSITIVE — AB

## 2023-01-29 MED ORDER — GABAPENTIN 100 MG PO CAPS: 100.0000 mg | ORAL_CAPSULE | Freq: Every day | ORAL | 3 refills | Status: DC

## 2023-01-29 MED ORDER — OSELTAMIVIR PHOSPHATE 75 MG PO CAPS: 75.0000 mg | ORAL_CAPSULE | Freq: Two times a day (BID) | ORAL | 0 refills | Status: DC

## 2023-01-29 MED ORDER — HYDROXYZINE PAMOATE 25 MG PO CAPS: 25.0000 mg | ORAL_CAPSULE | Freq: Every day | ORAL | 3 refills | Status: DC | PRN

## 2023-01-29 MED ORDER — TOPIRAMATE 50 MG PO TABS: 50.0000 mg | ORAL_TABLET | Freq: Every day | ORAL | 3 refills | Status: DC

## 2023-01-29 NOTE — Progress Notes (Signed)
Established patient visit   Patient: Brooke [name]   DOB: 11/15/1986   37 y.o. Female  MRN: [id number] Visit Date: 01/29/2023  Chief Complaint  Patient presents with   Nasal Congestion   Subjective    URI  This is a new problem. The current episode started in the past 7 days. The problem has been unchanged. The maximum temperature recorded prior to her arrival was 100.4 - 100.9 F. The fever has been present for 1 to 2 days. Associated symptoms include congestion, coughing, ear pain, headaches, rhinorrhea and sinus pain. Pertinent negatives include no abdominal pain, chest pain, diarrhea, dysuria, nausea, rash, sneezing, sore throat, vomiting or wheezing. She has tried acetaminophen, antihistamine and decongestant for the symptoms. Improvement on treatment: states that she doesn't get much help from the otc medication.    Medications: Outpatient Medications Prior to Visit  Medication Sig   DULoxetine (CYMBALTA) 30 MG capsule Take 1 capsule (30 mg total) by mouth 2 (two) times daily.   fluticasone (FLONASE) 50 MCG/ACT nasal spray Place into both nostrils daily.   ibuprofen (ADVIL,MOTRIN) 800 MG tablet Take 1 tablet (800 mg total) by mouth 3 (three) times daily.   [DISCONTINUED] gabapentin (NEURONTIN) 100 MG capsule Take 1 capsule (100 mg total) by mouth at bedtime.   [DISCONTINUED] hydrOXYzine (VISTARIL) 25 MG capsule Take 1 capsule (25 mg total) by mouth daily as needed.   [DISCONTINUED] topiramate (TOPAMAX) 50 MG tablet Take 1 tablet (50 mg total) by mouth at bedtime.   No facility-administered medications prior to visit.    Review of Systems  Constitutional:  Positive for fatigue and fever. Negative for activity change, appetite change and chills.  HENT:  Positive for congestion, ear pain, postnasal drip, rhinorrhea, sinus pressure and sinus pain. Negative for sneezing and sore throat.   Eyes: Negative.   Respiratory:  Positive for cough. Negative for chest tightness, shortness  of breath and wheezing.   Cardiovascular:  Negative for chest pain and palpitations.  Gastrointestinal:  Negative for abdominal pain, constipation, diarrhea, nausea and vomiting.  Endocrine: Negative for cold intolerance, heat intolerance, polydipsia and polyuria.  Genitourinary:  Negative for dyspareunia, dysuria, flank pain, frequency and urgency.  Musculoskeletal:  Negative for arthralgias, back pain and myalgias.  Skin:  Negative for rash.  Allergic/Immunologic: Positive for environmental allergies.  Neurological:  Positive for headaches. Negative for dizziness and weakness.  Hematological:  Negative for adenopathy.  Psychiatric/Behavioral:  The patient is not nervous/anxious.      Objective     Today's Vitals   01/29/23 0841  BP: (Abnormal) 141/91  Pulse: 86  SpO2: 99%  Weight: 183 lb 12.8 oz (83.4 kg)  Height: 5' 3"$  (1.6 m)   Body mass index is 32.56 kg/m.   Physical Exam Vitals and nursing note reviewed.  Constitutional:      Appearance: Normal appearance. She is well-developed. She is ill-appearing.  HENT:     Head: Normocephalic and atraumatic.     Nose: Congestion and rhinorrhea present.     Right Turbinates: Swollen.     Left Turbinates: Swollen.     Right Sinus: Maxillary sinus tenderness and frontal sinus tenderness present.     Left Sinus: No maxillary sinus tenderness or frontal sinus tenderness.  Eyes:     Pupils: Pupils are equal, round, and reactive to light.  Cardiovascular:     Rate and Rhythm: Normal rate and regular rhythm.     Pulses: Normal pulses.     Heart sounds: Normal heart  sounds.  Pulmonary:     Effort: Pulmonary effort is normal.     Breath sounds: Normal breath sounds.  Abdominal:     Palpations: Abdomen is soft.  Musculoskeletal:        General: Normal range of motion.     Cervical back: Normal range of motion and neck supple.  Lymphadenopathy:     Cervical: Cervical adenopathy present.  Skin:    General: Skin is warm and dry.      Capillary Refill: Capillary refill takes less than 2 seconds.  Neurological:     General: No focal deficit present.     Mental Status: She is alert and oriented to person, place, and time.  Psychiatric:        Mood and Affect: Mood normal.        Behavior: Behavior normal.        Thought Content: Thought content normal.        Judgment: Judgment normal.    Results for orders placed or performed in visit on 01/29/23  POCT Influenza A/B  Result Value Ref Range   Influenza A, POC Negative Negative   Influenza B, POC Positive (A) Negative    Assessment & Plan     1. Congestion of nasal sinus Testing for influenza done during today's visit.  - POCT Influenza A/B  2. Influenza B Treat with tamiflu twice daily for 5 days. Rest and increase fluids. Continue using OTC medication to control symptoms.  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 She may return to work without restriction on 02/01/2023.  - oseltamivir (TAMIFLU) 75 MG capsule; Take 1 capsule (75 mg total) by mouth 2 (two) times daily.  Dispense: 10 capsule; Refill: 0  3. Restless legs syndrome Continue to take gabapentin 100 mg at bedtime if needed.  - gabapentin (NEURONTIN) 100 MG capsule; Take 1 capsule (100 mg total) by mouth at bedtime.  Dispense: 30 capsule; Refill: 3  4. Generalized anxiety disorder Improved with addition of topamax 50 mg every evening. Continue duloxetine twice daily and hydroxyzine as needed.  - hydrOXYzine (VISTARIL) 25 MG capsule; Take 1 capsule (25 mg total) by mouth daily as needed.  Dispense: 30 capsule; Refill: 3 - topiramate (TOPAMAX) 50 MG tablet; Take 1 tablet (50 mg total) by mouth at bedtime.  Dispense: 30 tablet; Refill: 3   Return in about 3 months (around 04/29/2023) for health maintenance exam, with pap - reschedle from today , FBW a week prior to visit.        Ronnell Freshwater, NP  [facility] at [facility] [phone] (phone) [phone] (fax)  [place]

## 2023-01-31 ENCOUNTER — Other Ambulatory Visit: Payer: Self-pay | Admitting: Nurse Practitioner

## 2023-01-31 DIAGNOSIS — G2581 Restless legs syndrome: Secondary | ICD-10-CM

## 2023-02-26 ENCOUNTER — Other Ambulatory Visit: Payer: Self-pay

## 2023-02-26 DIAGNOSIS — F411 Generalized anxiety disorder: Secondary | ICD-10-CM

## 2023-02-26 MED ORDER — DULOXETINE HCL 30 MG PO CPEP: 30.0000 mg | ORAL_CAPSULE | Freq: Two times a day (BID) | ORAL | 2 refills | Status: DC

## 2023-06-21 ENCOUNTER — Emergency Department (HOSPITAL_BASED_OUTPATIENT_CLINIC_OR_DEPARTMENT_OTHER)
Admission: EM | Admit: 2023-06-21 | Discharge: 2023-06-21 | Disposition: A | Discharge status: Home / Self Care | Payer: Self-pay | Attending: Emergency Medicine | Admitting: Emergency Medicine

## 2023-06-21 ENCOUNTER — Other Ambulatory Visit: Payer: Self-pay

## 2023-06-21 ENCOUNTER — Emergency Department (HOSPITAL_BASED_OUTPATIENT_CLINIC_OR_DEPARTMENT_OTHER): Payer: Self-pay

## 2023-06-21 ENCOUNTER — Encounter (HOSPITAL_BASED_OUTPATIENT_CLINIC_OR_DEPARTMENT_OTHER): Payer: Self-pay | Admitting: Emergency Medicine

## 2023-06-21 DIAGNOSIS — K5792 Diverticulitis of intestine, part unspecified, without perforation or abscess without bleeding: Secondary | ICD-10-CM | POA: Insufficient documentation

## 2023-06-21 DIAGNOSIS — K529 Noninfective gastroenteritis and colitis, unspecified: Secondary | ICD-10-CM | POA: Diagnosis not present

## 2023-06-21 DIAGNOSIS — R1084 Generalized abdominal pain: Secondary | ICD-10-CM | POA: Diagnosis present

## 2023-06-21 LAB — URINALYSIS, ROUTINE W REFLEX MICROSCOPIC
Bilirubin Urine: NEGATIVE
Glucose, UA: NEGATIVE mg/dL
Hgb urine dipstick: NEGATIVE
Ketones, ur: NEGATIVE mg/dL
Leukocytes,Ua: NEGATIVE
Nitrite: NEGATIVE
Protein, ur: NEGATIVE mg/dL
Specific Gravity, Urine: 1.022 (ref 1.005–1.030)
pH: 5.5 (ref 5.0–8.0)

## 2023-06-21 LAB — COMPREHENSIVE METABOLIC PANEL
ALT: 32 U/L (ref 0–44)
AST: 22 U/L (ref 15–41)
Albumin: 4.3 g/dL (ref 3.5–5.0)
Alkaline Phosphatase: 55 U/L (ref 38–126)
Anion gap: 7 (ref 5–15)
BUN: 9 mg/dL (ref 6–20)
CO2: 26 mmol/L (ref 22–32)
Calcium: 8.9 mg/dL (ref 8.9–10.3)
Chloride: 105 mmol/L (ref 98–111)
Creatinine, Ser: 0.64 mg/dL (ref 0.44–1.00)
GFR, Estimated: >60 mL/min (ref >60)
Glucose, Bld: 90 mg/dL (ref 70–99)
Potassium: 3.7 mmol/L (ref 3.5–5.1)
Sodium: 138 mmol/L (ref 135–145)
Total Bilirubin: 0.3 mg/dL (ref 0.3–1.2)
Total Protein: 6.7 g/dL (ref 6.5–8.1)

## 2023-06-21 LAB — CBC
HCT: 34.1 % — ABNORMAL LOW (ref 36.0–46.0)
Hemoglobin: 11.4 g/dL — ABNORMAL LOW (ref 12.0–15.0)
MCH: 28.3 pg (ref 26.0–34.0)
MCHC: 33.4 g/dL (ref 30.0–36.0)
MCV: 84.6 fL (ref 80.0–100.0)
Platelets: 384 10*3/uL (ref 150–400)
RBC: 4.03 MIL/uL (ref 3.87–5.11)
RDW: 13.4 % (ref 11.5–15.5)
WBC: 5.5 10*3/uL (ref 4.0–10.5)
nRBC: 0 % (ref 0.0–0.2)

## 2023-06-21 LAB — PREGNANCY, URINE: Preg Test, Ur: NEGATIVE

## 2023-06-21 LAB — LIPASE, BLOOD: Lipase: 35 U/L (ref 11–51)

## 2023-06-21 MED ORDER — AMOXICILLIN-POT CLAVULANATE 875-125 MG PO TABS: 1.0000 | ORAL_TABLET | Freq: Two times a day (BID) | ORAL | 0 refills | Status: DC

## 2023-06-21 MED ORDER — MORPHINE SULFATE (PF) 4 MG/ML IV SOLN
4.0000 mg | Freq: Once | INTRAVENOUS | Status: AC
  Administered 2023-06-21: 4 mg via INTRAVENOUS
  Filled 2023-06-21: qty 1

## 2023-06-21 MED ORDER — FLUCONAZOLE 200 MG PO TABS: 200.0000 mg | ORAL_TABLET | Freq: Once | ORAL | 0 refills | Status: DC

## 2023-06-21 MED ORDER — ONDANSETRON 4 MG PO TBDP
4.0000 mg | ORAL_TABLET | Freq: Once | ORAL | Status: AC | PRN
  Administered 2023-06-21: 4 mg via ORAL
  Filled 2023-06-21: qty 1

## 2023-06-21 MED ORDER — FLUCONAZOLE 200 MG PO TABS: 200.0000 mg | ORAL_TABLET | Freq: Once | ORAL | 0 refills | Status: AC

## 2023-06-21 MED ORDER — ONDANSETRON 4 MG PO TBDP: 4.0000 mg | ORAL_TABLET | Freq: Three times a day (TID) | ORAL | 0 refills | Status: DC | PRN

## 2023-06-21 MED ORDER — IOHEXOL 300 MG/ML  SOLN
100.0000 mL | Freq: Once | INTRAMUSCULAR | Status: AC | PRN
  Administered 2023-06-21: 90 mL via INTRAVENOUS

## 2023-06-21 MED ORDER — SODIUM CHLORIDE 0.9 % IV BOLUS
1000.0000 mL | Freq: Once | INTRAVENOUS | Status: AC
  Administered 2023-06-21: 1000 mL via INTRAVENOUS

## 2023-06-21 NOTE — ED Notes (Signed)
Patient ambulatory to restroom with steady gait.

## 2023-06-21 NOTE — ED Provider Notes (Signed)
[place] EMERGENCY DEPARTMENT AT [facility] Provider Note   CSN: [id number] Arrival date & time: 06/21/23  1439     History  Chief Complaint  Patient presents with   Abdominal Pain    Brooke [name] is a 37 y.o. female.  Patient with noncontributory past medical history presents today with complaints of abdominal pain.  She states that pain is generalized throughout her abdomen and has been present for 2 days.  She denies any history of similar symptoms previously.  Endorses some nausea without vomiting and some diarrhea as well.  No known sick contacts.  No hematochezia or melena.  She has been taking Zofran and Imodium for her symptoms with some relief.  Denies any fevers or chills.  No urinary symptoms.  The history is provided by the patient. No language interpreter was used.  Abdominal Pain Associated symptoms: diarrhea and nausea        Home Medications Prior to Admission medications   Medication Sig Start Date End Date Taking? Authorizing Provider  DULoxetine (CYMBALTA) 30 MG capsule Take 1 capsule (30 mg total) by mouth 2 (two) times daily. 02/26/23   Carlean Jews, NP  fluticasone (FLONASE) 50 MCG/ACT nasal spray Place into both nostrils daily.    [provider]  gabapentin (NEURONTIN) 100 MG capsule Take 1 capsule (100 mg total) by mouth at bedtime. 01/29/23   Carlean Jews, NP  hydrOXYzine (VISTARIL) 25 MG capsule Take 1 capsule (25 mg total) by mouth daily as needed. 01/29/23   Carlean Jews, NP  ibuprofen (ADVIL,MOTRIN) 800 MG tablet Take 1 tablet (800 mg total) by mouth 3 (three) times daily. 08/10/16   Defrancesco, Prentice Docker, MD  oseltamivir (TAMIFLU) 75 MG capsule Take 1 capsule (75 mg total) by mouth 2 (two) times daily. 01/29/23   Carlean Jews, NP  topiramate (TOPAMAX) 50 MG tablet Take 1 tablet (50 mg total) by mouth at bedtime. 01/29/23   Carlean Jews, NP      Allergies    Hydrocodone    Review of Systems    Review of Systems  Gastrointestinal:  Positive for abdominal pain, diarrhea and nausea.  All other systems reviewed and are negative.   Physical Exam Updated Vital Signs BP (!) 147/96   Pulse 84   Temp 98.8 F (37.1 C) (Oral)   Resp 16   Ht 5\' 3"  (1.6 m)   Wt 82.6 kg   SpO2 100%   BMI 32.24 kg/m  Physical Exam Vitals and nursing note reviewed.  Constitutional:      General: She is not in acute distress.    Appearance: Normal appearance. She is normal weight. She is not ill-appearing, toxic-appearing or diaphoretic.  HENT:     Head: Normocephalic and atraumatic.  Cardiovascular:     Rate and Rhythm: Normal rate.  Pulmonary:     Effort: Pulmonary effort is normal. No respiratory distress.  Abdominal:     General: Abdomen is flat.     Palpations: Abdomen is soft.     Tenderness: There is generalized abdominal tenderness.  Musculoskeletal:        General: Normal range of motion.     Cervical back: Normal range of motion.  Skin:    General: Skin is warm and dry.  Neurological:     General: No focal deficit present.     Mental Status: She is alert.  Psychiatric:        Mood and Affect: Mood normal.  Behavior: Behavior normal.     ED Results / Procedures / Treatments   Labs (all labs ordered are listed, but only abnormal results are displayed) Labs Reviewed  CBC - Abnormal; Notable for the following components:      Result Value   Hemoglobin 11.4 (*)    HCT 34.1 (*)    All other components within normal limits  LIPASE, BLOOD  COMPREHENSIVE METABOLIC PANEL  URINALYSIS, ROUTINE W REFLEX MICROSCOPIC  PREGNANCY, URINE    EKG None  Radiology CT ABDOMEN PELVIS W CONTRAST  Result Date: 06/21/2023 CLINICAL DATA:  Abdominal pain. EXAM: CT ABDOMEN AND PELVIS WITH CONTRAST TECHNIQUE: Multidetector CT imaging of the abdomen and pelvis was performed using the standard protocol following bolus administration of intravenous contrast. RADIATION DOSE REDUCTION: This  exam was performed according to the departmental dose-optimization program which includes automated exposure control, adjustment of the mA and/or kV according to patient size and/or use of iterative reconstruction technique. CONTRAST:  90mL OMNIPAQUE IOHEXOL 300 MG/ML  SOLN COMPARISON:  None Available. FINDINGS: Lower chest: The visualized lung bases are clear. No intra-abdominal free air.  Trace free fluid within the pelvis. Hepatobiliary: No focal liver abnormality is seen. No gallstones, gallbladder wall thickening, or biliary dilatation. Pancreas: Unremarkable. No pancreatic ductal dilatation or surrounding inflammatory changes. Spleen: Normal in size without focal abnormality. Adrenals/Urinary Tract: The adrenal glands are unremarkable. The kidneys, visualized ureters, and urinary bladder appear unremarkable. Stomach/Bowel: There is diverticulosis of the distal descending/sigmoid colon. Mild adjacent stranding, likely chronic. Mild acute diverticulitis is less likely but not excluded. Clinical correlation recommended. Mild thickened distal small bowel loops within the pelvis likely related to underdistention. There is no bowel obstruction. The appendix is normal. Vascular/Lymphatic: The abdominal aorta and IVC are unremarkable. No portal venous gas. There is no adenopathy. Reproductive: The uterus is anteverted. A 2 cm posterior body fibroid. No adnexal masses. Other: None Musculoskeletal: Bilateral L5 pars defects. No listhesis. No acute osseous pathology. IMPRESSION: 1. Diverticulosis of the distal descending/sigmoid colon. Mild adjacent stranding favored to represent chronic changes and less likely mild acute diverticulitis. 2. Underdistention of the distal small bowel versus less likely mild enteritis. No bowel obstruction. Normal appendix. 3. Bilateral L5 pars defects. No listhesis. Electronically Signed   By: Elgie Collard M.D.   On: 06/21/2023 20:24    Procedures Procedures    Medications  Ordered in ED Medications  ondansetron (ZOFRAN-ODT) disintegrating tablet 4 mg (4 mg Oral Given 06/21/23 1943)  morphine (PF) 4 MG/ML injection 4 mg (4 mg Intravenous Given 06/21/23 1950)  sodium chloride 0.9 % bolus 1,000 mL (1,000 mLs Intravenous New Bag/Given 06/21/23 1944)  iohexol (OMNIPAQUE) 300 MG/ML solution 100 mL (90 mLs Intravenous Contrast Given 06/21/23 2008)    ED Course/ Medical Decision Making/ A&P                             Medical Decision Making Amount and/or Complexity of Data Reviewed Labs: ordered. Radiology: ordered.  Risk Prescription drug management.   This patient is a 37 y.o. female who presents to the ED for concern of abdominal pain, this involves an extensive number of treatment options, and is a complaint that carries with it a high risk of complications and morbidity. The emergent differential diagnosis prior to evaluation includes, but is not limited to,   gastroenteritis, appendicitis, Bowel obstruction, Bowel perforation. Gastroparesis, DKA, Hernia, Inflammatory bowel disease, pancreatitis, volvulus. This is not an exhaustive differential.  Past Medical History / Co-morbidities / Social History: N/A  Physical Exam: Physical exam performed. The pertinent findings include: generalized abdominal TTP without rebound or guarding  Lab Tests: I ordered, and personally interpreted labs.  The pertinent results include:  Hgb 11.4.  No other acute laboratory findings.   Imaging Studies: I ordered imaging studies including Ct abdomen pelvis. I independently visualized and interpreted imaging which showed   1. Diverticulosis of the distal descending/sigmoid colon. Mild adjacent stranding favored to represent chronic changes and less likely mild acute diverticulitis. 2. Underdistention of the distal small bowel versus less likely mild enteritis. No bowel obstruction. Normal appendix. 3. Bilateral L5 pars defects. No listhesis.  I agree with the  radiologist interpretation.   Medications: I ordered medication including fluids, morphine, zofran  for dehydration, pain, nausea. Reevaluation of the patient after these medicines showed that the patient improved. I have reviewed the patients home medicines and have made adjustments as needed.   Disposition: After consideration of the diagnostic results and the patients response to treatment, I feel that emergency department workup does not suggest an emergent condition requiring admission or immediate intervention beyond what has been performed at this time. The plan is: discharge with zofran and augmentin for enteritis/diverticulitis. Patient is nontoxic, nonseptic appearing, in no apparent distress.  Patient's pain and other symptoms adequately managed in emergency department.  Fluid bolus given.  Labs, imaging and vitals reviewed.  Patient does not meet the SIRS or Sepsis criteria.  On repeat exam patient does not have a surgical abdomin and there are no peritoneal signs.  No indication of appendicitis, bowel obstruction, bowel perforation, cholecystitis, diverticulitis, PID or ectopic pregnancy.  Patient discharged home with symptomatic treatment and given strict instructions for follow-up with their primary care physician.  I have also discussed reasons to return immediately to the ER.  Patient expresses understanding and agrees with plan.  Patient discharged in stable condition.  Final Clinical Impression(s) / ED Diagnoses Final diagnoses:  Diverticulitis  Enteritis    Rx / DC Orders ED Discharge Orders          Ordered    amoxicillin-clavulanate (AUGMENTIN) 875-125 MG tablet  Every 12 hours        06/21/23 2150    fluconazole (DIFLUCAN) 200 MG tablet   Once        06/21/23 2150    ondansetron (ZOFRAN-ODT) 4 MG disintegrating tablet  Every 8 hours PRN        06/21/23 2150          An After Visit Summary was printed and given to the patient.     Chundra, [place] 06/21/23  2151    Cathren Laine, MD 06/21/23 [phone]

## 2023-06-21 NOTE — ED Notes (Signed)
Received report from [place], Charity fundraiser. Patient resting quietly in stretcher, respirations even, unlabored, no acute distress noted. Awaiting provider evaluation.

## 2023-06-21 NOTE — Discharge Instructions (Signed)
As we discussed, CT imaging of your abdomen showed that you have diverticulitis.  I have given you a prescription for an antibiotic for you to take as prescribed in its entirety for management of this.  I also recommend bowel rest with mostly liquid diet and foods low in fiber.  I have also given you Zofran for any residual nausea.  Follow-up with your primary care doctor in the next few days.  Return for development of any new or worsening symptoms.

## 2023-06-21 NOTE — ED Triage Notes (Signed)
Pt arrives to ED with c/o epigastric pain x2 days associated with nausea and diarrhea.

## 2023-06-21 NOTE — ED Notes (Signed)
Reviewed AVS with patient, patient expressed understanding of directions, denies further questions at this time. 

## 2023-06-25 ENCOUNTER — Emergency Department (HOSPITAL_BASED_OUTPATIENT_CLINIC_OR_DEPARTMENT_OTHER)
Admission: EM | Admit: 2023-06-25 | Discharge: 2023-06-25 | Disposition: A | Discharge status: Home / Self Care | Payer: Managed Care, Other (non HMO) | Attending: Emergency Medicine | Admitting: Emergency Medicine

## 2023-06-25 ENCOUNTER — Other Ambulatory Visit: Payer: Self-pay

## 2023-06-25 ENCOUNTER — Encounter (HOSPITAL_BASED_OUTPATIENT_CLINIC_OR_DEPARTMENT_OTHER): Payer: Self-pay

## 2023-06-25 ENCOUNTER — Emergency Department (HOSPITAL_BASED_OUTPATIENT_CLINIC_OR_DEPARTMENT_OTHER): Payer: Managed Care, Other (non HMO)

## 2023-06-25 DIAGNOSIS — K5732 Diverticulitis of large intestine without perforation or abscess without bleeding: Secondary | ICD-10-CM | POA: Diagnosis not present

## 2023-06-25 DIAGNOSIS — K5792 Diverticulitis of intestine, part unspecified, without perforation or abscess without bleeding: Secondary | ICD-10-CM

## 2023-06-25 DIAGNOSIS — R109 Unspecified abdominal pain: Secondary | ICD-10-CM | POA: Diagnosis present

## 2023-06-25 LAB — CBC WITH DIFFERENTIAL/PLATELET
Abs Immature Granulocytes: 0.02 10*3/uL (ref 0.00–0.07)
Basophils Absolute: 0 10*3/uL (ref 0.0–0.1)
Basophils Relative: 0 %
Eosinophils Absolute: 0.2 10*3/uL (ref 0.0–0.5)
Eosinophils Relative: 2 %
HCT: 33.2 % — ABNORMAL LOW (ref 36.0–46.0)
Hemoglobin: 11.3 g/dL — ABNORMAL LOW (ref 12.0–15.0)
Immature Granulocytes: 0 %
Lymphocytes Relative: 19 %
Lymphs Abs: 1.8 10*3/uL (ref 0.7–4.0)
MCH: 27.9 pg (ref 26.0–34.0)
MCHC: 34 g/dL (ref 30.0–36.0)
MCV: 82 fL (ref 80.0–100.0)
Monocytes Absolute: 0.7 10*3/uL (ref 0.1–1.0)
Monocytes Relative: 8 %
Neutro Abs: 6.9 10*3/uL (ref 1.7–7.7)
Neutrophils Relative %: 71 %
Platelets: 389 10*3/uL (ref 150–400)
RBC: 4.05 MIL/uL (ref 3.87–5.11)
RDW: 13.2 % (ref 11.5–15.5)
WBC: 9.6 10*3/uL (ref 4.0–10.5)
nRBC: 0 % (ref 0.0–0.2)

## 2023-06-25 LAB — URINALYSIS, ROUTINE W REFLEX MICROSCOPIC
Bilirubin Urine: NEGATIVE
Glucose, UA: NEGATIVE mg/dL
Hgb urine dipstick: NEGATIVE
Ketones, ur: NEGATIVE mg/dL
Leukocytes,Ua: NEGATIVE
Nitrite: NEGATIVE
Protein, ur: NEGATIVE mg/dL
Specific Gravity, Urine: 1.025 (ref 1.005–1.030)
pH: 7 (ref 5.0–8.0)

## 2023-06-25 LAB — COMPREHENSIVE METABOLIC PANEL
ALT: 41 U/L (ref 0–44)
AST: 30 U/L (ref 15–41)
Albumin: 4.2 g/dL (ref 3.5–5.0)
Alkaline Phosphatase: 70 U/L (ref 38–126)
Anion gap: 9 (ref 5–15)
BUN: 7 mg/dL (ref 6–20)
CO2: 26 mmol/L (ref 22–32)
Calcium: 9.3 mg/dL (ref 8.9–10.3)
Chloride: 104 mmol/L (ref 98–111)
Creatinine, Ser: 0.67 mg/dL (ref 0.44–1.00)
GFR, Estimated: >60 mL/min (ref >60)
Glucose, Bld: 88 mg/dL (ref 70–99)
Potassium: 4 mmol/L (ref 3.5–5.1)
Sodium: 139 mmol/L (ref 135–145)
Total Bilirubin: 0.5 mg/dL (ref 0.3–1.2)
Total Protein: 7 g/dL (ref 6.5–8.1)

## 2023-06-25 LAB — LIPASE, BLOOD: Lipase: 34 U/L (ref 11–51)

## 2023-06-25 LAB — PREGNANCY, URINE: Preg Test, Ur: NEGATIVE

## 2023-06-25 MED ORDER — IOHEXOL 300 MG/ML  SOLN
100.0000 mL | Freq: Once | INTRAMUSCULAR | Status: AC | PRN
  Administered 2023-06-25: 100 mL via INTRAVENOUS

## 2023-06-25 MED ORDER — METRONIDAZOLE 500 MG PO TABS: 500.0000 mg | ORAL_TABLET | Freq: Two times a day (BID) | ORAL | 0 refills | Status: DC

## 2023-06-25 MED ORDER — ONDANSETRON HCL 4 MG/2ML IJ SOLN
4.0000 mg | Freq: Once | INTRAMUSCULAR | Status: AC
  Administered 2023-06-25: 4 mg via INTRAVENOUS
  Filled 2023-06-25: qty 2

## 2023-06-25 MED ORDER — MORPHINE SULFATE (PF) 4 MG/ML IV SOLN
4.0000 mg | Freq: Once | INTRAVENOUS | Status: AC
  Administered 2023-06-25: 4 mg via INTRAVENOUS
  Filled 2023-06-25: qty 1

## 2023-06-25 MED ORDER — MORPHINE SULFATE 15 MG PO TABS: 7.5000 mg | ORAL_TABLET | ORAL | 0 refills | Status: DC | PRN

## 2023-06-25 MED ORDER — CIPROFLOXACIN HCL 500 MG PO TABS: 500.0000 mg | ORAL_TABLET | Freq: Two times a day (BID) | ORAL | 0 refills | Status: DC

## 2023-06-25 NOTE — ED Provider Notes (Signed)
[facility] Provider Note   CSN: [id number] Arrival date & time: 06/25/23  1032     History  Chief Complaint  Patient presents with   Abdominal Pain    Brooke [name] is a 37 y.o. female.  37 yo F with a chief complaint of left lower quadrant abdominal discomfort.  This has been going on for the better part of a week or so.  She has had a lot of gas and diarrhea as well.  Not dark or bloody.  No known sick contacts around her.  She works in a [employer], broadcasting/film/video.  No suspicious food intake no recent travel.  Feels like the pain is got a lot worse.  Was seen in the emergency department at the onset of her symptoms and had CT imaging that was largely unremarkable.   Abdominal Pain      Home Medications Prior to Admission medications   Medication Sig Start Date End Date Taking? Authorizing Provider  ciprofloxacin (CIPRO) 500 MG tablet Take 1 tablet (500 mg total) by mouth every 12 (twelve) hours. 06/25/23  Yes Melene Plan, DO  metroNIDAZOLE (FLAGYL) 500 MG tablet Take 1 tablet (500 mg total) by mouth 2 (two) times daily. 06/25/23  Yes Melene Plan, DO  morphine (MSIR) 15 MG tablet Take 0.5 tablets (7.5 mg total) by mouth every 4 (four) hours as needed for severe pain. 06/25/23  Yes Melene Plan, DO  DULoxetine (CYMBALTA) 30 MG capsule Take 1 capsule (30 mg total) by mouth 2 (two) times daily. 02/26/23   Carlean Jews, NP  fluticasone (FLONASE) 50 MCG/ACT nasal spray Place into both nostrils daily.    [provider]  gabapentin (NEURONTIN) 100 MG capsule Take 1 capsule (100 mg total) by mouth at bedtime. 01/29/23   Carlean Jews, NP  hydrOXYzine (VISTARIL) 25 MG capsule Take 1 capsule (25 mg total) by mouth daily as needed. 01/29/23   Carlean Jews, NP  ibuprofen (ADVIL,MOTRIN) 800 MG tablet Take 1 tablet (800 mg total) by mouth 3 (three) times daily. 08/10/16   Defrancesco, Prentice Docker, MD  ondansetron (ZOFRAN-ODT) 4 MG  disintegrating tablet Take 1 tablet (4 mg total) by mouth every 8 (eight) hours as needed for nausea or vomiting. 06/21/23   Smoot, Shawn Route, PA-C  oseltamivir (TAMIFLU) 75 MG capsule Take 1 capsule (75 mg total) by mouth 2 (two) times daily. 01/29/23   Carlean Jews, NP  topiramate (TOPAMAX) 50 MG tablet Take 1 tablet (50 mg total) by mouth at bedtime. 01/29/23   Carlean Jews, NP      Allergies    Hydrocodone    Review of Systems   Review of Systems  Gastrointestinal:  Positive for abdominal pain.    Physical Exam Updated Vital Signs BP (!) 116/95   Pulse 87   Temp 98.2 F (36.8 C)   Resp 17   Ht 5\' 3"  (1.6 m)   Wt 82.6 kg   LMP 06/21/2023   SpO2 98%   BMI 32.24 kg/m  Physical Exam Vitals and nursing note reviewed.  Constitutional:      General: She is not in acute distress.    Appearance: She is well-developed. She is not diaphoretic.  HENT:     Head: Normocephalic and atraumatic.  Eyes:     Pupils: Pupils are equal, round, and reactive to light.  Cardiovascular:     Rate and Rhythm: Normal rate and regular rhythm.  Heart sounds: No murmur heard.    No friction rub. No gallop.  Pulmonary:     Effort: Pulmonary effort is normal.     Breath sounds: No wheezing or rales.  Abdominal:     General: There is no distension.     Palpations: Abdomen is soft.     Tenderness: There is no abdominal tenderness.     Comments: Pain to the left lower quadrant focally.  No obvious rebound or guarding.  Musculoskeletal:        General: No tenderness.     Cervical back: Normal range of motion and neck supple.  Skin:    General: Skin is warm and dry.  Neurological:     Mental Status: She is alert and oriented to person, place, and time.  Psychiatric:        Behavior: Behavior normal.     ED Results / Procedures / Treatments   Labs (all labs ordered are listed, but only abnormal results are displayed) Labs Reviewed  CBC WITH DIFFERENTIAL/PLATELET - Abnormal; Notable  for the following components:      Result Value   Hemoglobin 11.3 (*)    HCT 33.2 (*)    All other components within normal limits  GASTROINTESTINAL PANEL BY PCR, STOOL (REPLACES STOOL CULTURE)  C DIFFICILE QUICK SCREEN W PCR REFLEX    COMPREHENSIVE METABOLIC PANEL  LIPASE, BLOOD  URINALYSIS, ROUTINE W REFLEX MICROSCOPIC  PREGNANCY, URINE    EKG None  Radiology CT ABDOMEN PELVIS W CONTRAST  Result Date: 06/25/2023 CLINICAL DATA:  LLQ abdominal pain EXAM: CT ABDOMEN AND PELVIS WITH CONTRAST TECHNIQUE: Multidetector CT imaging of the abdomen and pelvis was performed using the standard protocol following bolus administration of intravenous contrast. RADIATION DOSE REDUCTION: This exam was performed according to the departmental dose-optimization program which includes automated exposure control, adjustment of the mA and/or kV according to patient size and/or use of iterative reconstruction technique. CONTRAST:  OMNIPAQUE IOHEXOL 300 MG/ML  SOLN COMPARISON:  CT scan abdomen and pelvis from 06/21/2023. FINDINGS: Lower chest: The lung bases are clear. No pleural effusion. The heart is normal in size. No pericardial effusion. Hepatobiliary: The liver is normal in size. Non-cirrhotic configuration. No suspicious mass. These is mild diffuse hepatic steatosis. No intrahepatic or extrahepatic bile duct dilation. No calcified gallstones. Normal gallbladder wall thickness. No pericholecystic inflammatory changes. Pancreas: Unremarkable. No pancreatic ductal dilatation or surrounding inflammatory changes. Spleen: Within normal limits. No focal lesion. Adrenals/Urinary Tract: Adrenal glands are unremarkable. No suspicious renal mass. No hydronephrosis. No renal or ureteric calculi. Unremarkable urinary bladder. Stomach/Bowel: There is short segment of colon (approximately 4-5 cm), at the level of junction of descending colon and proximal sigmoid colon, exhibiting mild irregular wall thickening and  pericolonic fat stranding on the background of several diverticula, compatible with acute uncomplicated diverticulitis. No walled off abscess or loculated collection. No pneumoperitoneum. No disproportionate dilation of small or large bowel loops to suggest bowel obstruction. The appendix is unremarkable. Vascular/Lymphatic: There is small amount of ascites mainly in the dependent pelvis. There is a subcentimeter sized calcification in the right side of the dependent pelvis, likely a detached and calcified epiploic appendage. No pneumoperitoneum. No abdominal or pelvic lymphadenopathy, by size criteria. No aneurysmal dilation of the major abdominal arteries. Reproductive: Normal-size anteverted uterus. Redemonstration of an approximately 2.1 x 2.3 cm lobulated heterogeneous but predominantly hyperattenuating lesion in the left side of the fundus, favored to represent intramural leiomyoma. Unremarkable bilateral ovaries. Other: There is a tiny  fat containing umbilical hernia. The soft tissues and abdominal wall are otherwise unremarkable. Musculoskeletal: No suspicious osseous lesions. 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 IMPRESSION: 1. Acute uncomplicated diverticulitis at the level of junction of descending colon and proximal sigmoid colon. No walled off abscess or loculated collection. No pneumoperitoneum. 2. There is a 2.3 cm lobulated heterogeneous but predominantly hyperattenuating lesion in the left side of the uterine fundus, favored to represent intramural leiomyoma. This could be confirmed with sonographic evaluation on a nonemergent basis. 3. Multiple other nonacute observations, as described above. Electronically Signed   By: Jules Schick M.D.   On: 06/25/2023 13:50    Procedures Procedures    Medications Ordered in ED Medications  morphine (PF) 4 MG/ML injection 4 mg (4 mg Intravenous Given 06/25/23 1153)  ondansetron (ZOFRAN) injection 4 mg (4 mg Intravenous  Given 06/25/23 1153)  iohexol (OMNIPAQUE) 300 MG/ML solution 100 mL (100 mLs Intravenous Contrast Given 06/25/23 1318)    ED Course/ Medical Decision Making/ A&P                             Medical Decision Making Amount and/or Complexity of Data Reviewed Labs: ordered. Radiology: ordered.  Risk Prescription drug management.   37 yo F with a chief complaint of left lower quadrant abdominal pain.  She has had abdominal pain for the better part of the week.  Has had diarrhea with it as well.  No fevers no vomiting but has some nausea.  I reviewed the patient's last emergency department visit.  Had a very thorough workup with CT imaging.  She is describing worsening pain and has significant tenderness to the left lower quadrant I will repeat the CT scan here to evaluate for possible complication of diverticulitis.  CT with acute diverticulitis without complication.  Switch abx.    2:06 PM:  I have discussed the diagnosis/risks/treatment options with the patient.  Evaluation and diagnostic testing in the emergency department does not suggest an emergent condition requiring admission or immediate intervention beyond what has been performed at this time.  They will follow up with PCP. We also discussed returning to the ED immediately if new or worsening sx occur. We discussed the sx which are most concerning (e.g., sudden worsening pain, fever, inability to tolerate by mouth) that necessitate immediate return. Medications administered to the patient during their visit and any new prescriptions provided to the patient are listed below.  Medications given during this visit Medications  morphine (PF) 4 MG/ML injection 4 mg (4 mg Intravenous Given 06/25/23 1153)  ondansetron (ZOFRAN) injection 4 mg (4 mg Intravenous Given 06/25/23 1153)  iohexol (OMNIPAQUE) 300 MG/ML solution 100 mL (100 mLs Intravenous Contrast Given 06/25/23 1318)     The patient appears reasonably screen and/or stabilized for  discharge and I doubt any other medical condition or other [facility] requiring further screening, evaluation, or treatment in the ED at this time prior to discharge.          Final Clinical Impression(s) / ED Diagnoses Final diagnoses:  Diverticulitis    Rx / DC Orders ED Discharge Orders          Ordered    ciprofloxacin (CIPRO) 500 MG tablet  Every 12 hours        06/25/23 1354    metroNIDAZOLE (FLAGYL) 500 MG tablet  2 times daily        06/25/23 1354    morphine (MSIR) 15  MG tablet  Every 4 hours PRN        06/25/23 1356              Melene Plan, DO 06/25/23 1406

## 2023-06-25 NOTE — Discharge Instructions (Addendum)
Follow up with your family doc in the office.  Follow up with GI.   Take 4 over the counter ibuprofen tablets 3 times a day or 2 over-the-counter naproxen tablets twice a day for pain. Also take tylenol 1000mg (2 extra strength) four times a day.    Then take the pain medicine if you feel like you need it. Narcotics do not help with the pain, they only make you care about it less.  You can become addicted to this, people may break into your house to steal it.  It will constipate you.  If you drive under the influence of this medicine you can get a DUI.

## 2023-10-08 ENCOUNTER — Telehealth (HOSPITAL_COMMUNITY): Payer: Self-pay | Admitting: Professional

## 2023-10-08 ENCOUNTER — Ambulatory Visit (HOSPITAL_COMMUNITY)
Admission: EM | Admit: 2023-10-08 | Discharge: 2023-10-08 | Disposition: A | Discharge status: Home / Self Care | Payer: No Typology Code available for payment source

## 2023-10-08 DIAGNOSIS — F332 Major depressive disorder, recurrent severe without psychotic features: Secondary | ICD-10-CM | POA: Diagnosis not present

## 2023-10-08 NOTE — ED Provider Notes (Signed)
[facility]al [facility] Urgent Care Medical Screening Exam  Patient Name: Brooke [name] MRN: [id number] Date of Evaluation: 10/08/23 Chief Complaint:  Increased depression and anxiety Diagnosis:  Final diagnoses:  Severe episode of recurrent major depressive disorder, without psychotic features (HCC)    History of Present illness: Brooke [name] is a 37 y.o. female patient presented to [facility] as a walk in unaccompanied with complaints of increased depression and anxiety.  Brooke [name], 37 y.o., female patient seen face to face by this provider, chart reviewed, and consulted with Dr. Lucianne Muss on 10/08/23.  Per chart review patient has a past psychiatric history of MDD, anxiety, and ADD.  Reports she has been told that she could possibly have bipolar disorder but does not think she has officially been diagnosed with this.  She was most recently prescribed Cymbalta 30 mg daily, hydroxyzine 25 mg 3 times daily as needed, and gabapentin 100 mg nightly.  She has not taken any medications in 6-8 months.  She endorses occasional THC vape and alcohol use.  She lives in home with her partner.  She works full-time.  Brooke [name] presents today requesting medications for depression and anxiety.  During evaluation Brooke [name] is observed sitting in the assessment room in no acute distress.  She is casually dressed.  She is alert/oriented x 4, cooperative, attentive, and responses were relevant and appropriate to assessment questions.  She spoke in a clear tone at moderate volume, and normal pace, with good eye contact.  She appears anxious.  She endorses depression with feelings of hopelessness, fatigue, decreased focus, self-isolation, decreased appetite and decreased sleep.  She has a depressed affect.  She is tearful at times throughout the assessment.  She is unable to identify any specific trigger/stressors other than day to day problems.  She has had ongoing depression all  her life but feels as though over the past 3-6 months she has noticed a significant increase.  She denies any current suicidal ideations with intent or plan.  She endorses having passive thoughts of, "if I were to go to sleep and not wake up I would be okay with that, but I would never do anything to intentionally hurt myself".  She verbally contracts for safety.  She denies any access to firearms/weapons.  She denies auditory/visual hallucinations.  She does not appear manic, psychotic, or paranoid.  She does not appear to be responding to internal/external stimuli.  She is able to answer questions appropriately.  Discussed restarting Cymbalta 30 mg daily for depression and hydroxyzine 25 mg 3 times daily as needed for anxiety.  Contacted patient's pharmacy [facility] on [address].  There are currently multiple refills of this medication on file by patient's PCP.  Explained this to the patient, she was unaware.  Requested from pharmacy to refill medications.  Provided outpatient psychiatric resources for medication management and therapy.  Discussed [facility] program and patient is interested.  Referral was made.  At this time Brooke [name] is educated and verbalizes understanding of mental health resources and other crisis services in the community. She is instructed to call 911 and present to the nearest emergency room should she experience any suicidal/homicidal ideation, auditory/visual/hallucinations, or detrimental worsening of her mental health condition.  She was a also advised by Clinical research associate that she could call the toll-free phone on back of  insurance card to assist with identifying counselors and agencies in network.     Flowsheet Row ED from 10/08/2023 in [place]  [facility] ED from 06/25/2023 in [facility] Emergency Department at [name] ED from 06/21/2023 in [facility] Emergency Department at [name]  C-SSRS RISK CATEGORY Low Risk No Risk No  Risk       Psychiatric Specialty Exam  Presentation  General Appearance:Appropriate for Environment; Casual  Eye Contact:Good  Speech:Clear and Coherent; Normal Rate  Speech Volume:Normal  Handedness:Right   Mood and Affect  Mood: Anxious; Depressed  Affect: Tearful   Thought Process  Thought Processes: Coherent  Descriptions of Associations:Intact  Orientation:Full (Time, Place and Person)  Thought Content:Logical    Hallucinations:None  Ideas of Reference:None  Suicidal Thoughts:No  Homicidal Thoughts:No   Sensorium  Memory: Immediate Good; Recent Good; Remote Good  Judgment: Good  Insight: Good   Executive Functions  Concentration: Good  Attention Span: Good  Recall: Good  Fund of Knowledge: Good  Language: Good   Psychomotor Activity  Psychomotor Activity: Normal   Assets  Assets: Communication Skills; Desire for Improvement; Physical Health; Resilience; Financial Resources/Insurance; Housing; Intimacy; Vocational/Educational   Sleep  Sleep: Fair  Number of hours:  5   Physical Exam: Physical Exam Vitals and nursing note reviewed.  Constitutional:      General: She is not in acute distress.    Appearance: Normal appearance. She is not ill-appearing.  Eyes:     General:        Right eye: No discharge.        Left eye: No discharge.  Cardiovascular:     Rate and Rhythm: Normal rate.  Pulmonary:     Effort: Pulmonary effort is normal.  Musculoskeletal:        General: Normal range of motion.     Cervical back: Normal range of motion.  Skin:    Coloration: Skin is not jaundiced or pale.  Neurological:     Mental Status: She is alert and oriented to person, place, and time.  Psychiatric:        Attention and Perception: Attention and perception normal.        Mood and Affect: Mood is anxious and depressed. Affect is tearful.        Speech: Speech normal.        Behavior: Behavior normal. Behavior is  cooperative.        Thought Content: Thought content normal.        Cognition and Memory: Cognition normal.        Judgment: Judgment normal.    Review of Systems  Constitutional: Negative.   HENT: Negative.    Eyes: Negative.   Respiratory: Negative.    Cardiovascular: Negative.   Musculoskeletal: Negative.   Skin: Negative.   Neurological: Negative.   Psychiatric/Behavioral:  Positive for depression. The patient is nervous/anxious.    Blood pressure 138/89, pulse 90, temperature 98.6 F (37 C), temperature source Oral, resp. rate 20, SpO2 100%. There is no height or weight on file to calculate BMI.  Musculoskeletal: Strength & Muscle Tone: within normal limits Gait & Station: normal Patient leans: N/A   [facility] MSE Discharge Disposition for Follow up and Recommendations: Based on my evaluation the patient does not appear to have an emergency medical condition and can be discharged with resources and follow up care in outpatient services for Medication Management, Partial Hospitalization Program, and Individual Therapy   Discharge patient  Provided outpatient psychiatric resources for medication management and therapy.  Referral made to [facility] program   Ardis Hughs, NP 10/08/2023, 9:51 AM

## 2023-10-08 NOTE — Progress Notes (Signed)
   10/08/23 0843  [facility] Triage Screening (Walk-ins at [facility] only)  How Did You Hear About [place]? Self  What Is the Reason for Your Visit/Call Today? Brooke [name] is a 37 year old female presenting to [facility] unaccompanied. Pt reports she has had severe ongoing depression within the past 3 months. Pt mentions that she has been off her medication for a year, and is interested in getting back on her medication today. Pt reports she has had ongoing stress on the day-to-day. Pt does attend couples counseling with her partner and finds it to be helpful with her ongoing stress. However, pt is noticing that her depression is "getting worse". Pt mentions she would like to find an indivudal therapist and consistent medical care at this time of assessment. Pt does report to have self harmed over a year ago, but is not currently self harming at this time. Pt endorses suicidal thoughts, but recalls she does not want to act on them. No past suicide attempt reported. Pt denies drug use, but does report to have drank "3 beer cans" last night. Pt denies hx of alcohol use. Pt does mention that she will occasionally smoke a "THC pen". Pt also denies HI and AVH at this time.  How Long Has This Been Causing You Problems? 1 wk - 1 month  Have You Recently Had Any Thoughts About Hurting Yourself? Yes  How long ago did you have thoughts about hurting yourself? over a year  Are You Planning to Commit Suicide/Harm Yourself At This time? No  Have you Recently Had Thoughts About Hurting Someone Brooke [name]? No  Are You Planning To Harm Someone At This Time? No  Are you currently experiencing any auditory, visual or other hallucinations? No  Have You Used Any Alcohol or Drugs in the Past 24 Hours? Yes  How long ago did you use Drugs or Alcohol? yesterday  What Did You Use and How Much? beer, 3 cans  Do you have any current medical co-morbidities that require immediate attention? No  Clinician description of patient physical  appearance/behavior: cooperative, anxious  What Do You Feel Would Help You the Most Today? Medication(s);Treatment for Depression or other mood problem  If access to [facility] was not available, would you have sought care in the Emergency Department? No  Determination of Need Routine (7 days)  Options For Referral Intensive Outpatient Therapy;Medication Management

## 2023-10-08 NOTE — Discharge Instructions (Signed)
Discharge recommendations:   Medications: Patient is to take medications as prescribed. The patient or patient's guardian is to contact a medical professional and/or outpatient provider to address any new side effects that develop. The patient or the patient's guardian should update outpatient providers of any new medications and/or medication changes.    Outpatient Follow up: Please review list of outpatient resources for psychiatry and counseling. Please follow up with your primary care provider for all medical related needs.    Therapy: We recommend that patient participate in individual therapy to address mental health concerns.   Atypical antipsychotics: If you are prescribed an atypical antipsychotic, it is recommended that your height, weight, BMI, blood pressure, fasting lipid panel, and fasting blood sugar be monitored by your outpatient providers.  Safety:   The following safety precautions should be taken:   No sharp objects. This includes scissors, razors, scrapers, and putty knives.   Chemicals should be removed and locked up.   Medications should be removed and locked up.   Weapons should be removed and locked up. This includes firearms, knives and instruments that can be used to cause injury.   The patient should abstain from use of illicit substances/drugs and abuse of any medications.  If symptoms worsen or do not continue to improve or if the patient becomes actively suicidal or homicidal then it is recommended that the patient return to the closest hospital emergency department, the [facility], or call 911 for further evaluation and treatment. National Suicide Prevention Lifeline 1-800-SUICIDE or 1-800-273-8255.  About 988 988 offers 24/7 access to trained crisis counselors who can help people experiencing mental health-related distress. People can call or text 988 or chat 988lifeline.org for themselves or if they are worried about a  loved one who may need crisis support.         It is imperative that you follow through with treatment recommendations within 5-7 days from the day of discharge to mitigate further risk to your safety and overall mental well-being.  A list of outpatient therapy and psychiatric providers for medication management has been provided below to get you started in finding the right provider for you.            [place]  [facility] [address] [place], [place], [address] [phone] phone (Medicare, Private insurance except Tricare, Blue Local, and Blue Home)  [facility] [address] [place], [place], [address] [phone] phone (Aetna, AmeriHealth [place], BCBS, Cigna, Evernorth, Friday Health Plans, Gateway Health, BCBS Healthy Blue, Humana, Magellan Health, Medcost, Medicare, Medicaid, Optum, Tricare, UHC, UHC Community Plan, Wellcare)  [facility] [address] [place], [place], [address] [phone] phone [phone] phone [phone] fax  [facility] [address] [place], [place], [address] [phone] phone (Call to confirm insurance coverage) Consultation & Support Services     o Drop-In Hours: 1:00 PM to 5:00 PM     o Days: Monday - Thursday  Crisis Services (24/7)   [facility] [address] [place], [place], [address] [phone] phone (Healthy Blue [place], UHC, [place] Medicaid, Vaya and Partners, Humana)      [facility] [address] [place], [place], [address] [phone] phone [email or web address]  (to complete the intake form and upload ID and insurance cards)  [facility] [address] , , [address] [phone] phone (Aetna, Alliance, BCBS,   D[place] Complete Health, Cigna, Medicare, Optum, UHC, Wellcare, and  certain Medicaid plans)  [facility] [address] [place], [place], [address] [phone] phone [phone] fax (Medicaid, Medicare, Self-pay, call about other insurance coverage)  [facility] (age 13+) [address] [place], [place], [address] [phone] hone [phone] fax (Cigna, MedCost, BCBS, Aetna, Magellan, First Health, Healthgram, Humana, Tricare, Multiplan, certain Medicare providers, UHC, UMR)  [facility] [address] [place], [place], [address] [phone] phone (Medicare, Medicaid, Aetna, Cigna, call about other insurance coverage)  [facility] [address] [place], [place], [address] [phone] phone [phone] fax (Call [phone] to see what insurance is accepted) (Gerald Plovsky, MD specializes in geropsych)  [facility]  (medication management only) [address] [place], [place], [address] [phone] phone [phone] fax (Medicare, Medicaid, BCBS, Tricare, Humana, MedCost, Cigna, UHC, UMR)  [facility] (medication management only) [address] [place], [place], [address] [phone] phone [phone] fax (Cigna, Medicare, BCBS, Aetna, Tricare [place])  [facility] [address] [place], [place], [address] [phone] phone [phone] fax (Aetna, BCBS, Cigna, Magellan, MedCost, UHC, [place] Medicaid/[place] Health Choice)  [facility] [address] [place], [place], [address] [phone] phone [phone] fax (Medicare, Medicaid, BCBS)  [facility] [address] [place], [place] [address] [phone] phone (Aetna, BCBS, Cigna, CBHA, MedCost, Medicare, Magellan, UHC) Does genetic testing for medications; does transcranial magnetic stimulation along with basic services)  [facility] [address] [place], [place], [address] [phone] phone (Call about insurance coverage)  [facility] [address] [place], [place], [address] [phone] phone [phone] fax (Call about insurance coverage)  [facility] [address] [place], [place], [address] [phone] phone [phone] fax (Call about insurance coverage)  [facility] [address] [place], [place], [address] [phone] phone (Medicaid, Tricare, BCBS, Aetna, Humana)  [facility] [address] [place], [place], [address] [phone] phone (Medicaid, Medicare, call about other insurance coverage)  [facility] [address] [place], [place], [address] [phone] phone [phone] fax (Medicaid, Medicare, Tricare, call about other insurance coverage)  [facility] [address] [place], [place] , [address] [phone] phone (Aetna, BCBS, Cigna, MedCost, Tricare, Medicaid types - Alliance, AmeriHealth, Partners, Vaya, [place] Health Choice, Healthy Blue, Raven, Wellcare, and Complete)  [facility] [address] [place], [place], [address] [phone] phone Completely online treatment platform Contact: Jonay Argier - Behavioral Health Outreach Specialist [phone] phone [phone] fax (Aetna, BCBS, Cigna, Friday Health Plan, Humana, Magellan, MedCost, Medicaid, Medicare, [facility] Optum)                                               

## 2023-10-09 ENCOUNTER — Telehealth (HOSPITAL_COMMUNITY): Payer: Self-pay | Admitting: Licensed Clinical Social Worker

## 2024-03-16 ENCOUNTER — Emergency Department (HOSPITAL_BASED_OUTPATIENT_CLINIC_OR_DEPARTMENT_OTHER)

## 2024-03-16 ENCOUNTER — Encounter (HOSPITAL_BASED_OUTPATIENT_CLINIC_OR_DEPARTMENT_OTHER): Payer: Self-pay | Admitting: Emergency Medicine

## 2024-03-16 ENCOUNTER — Emergency Department (HOSPITAL_BASED_OUTPATIENT_CLINIC_OR_DEPARTMENT_OTHER)
Admission: EM | Admit: 2024-03-16 | Discharge: 2024-03-16 | Disposition: A | Discharge status: Home / Self Care | Attending: Emergency Medicine | Admitting: Emergency Medicine

## 2024-03-16 ENCOUNTER — Other Ambulatory Visit: Payer: Self-pay

## 2024-03-16 DIAGNOSIS — R1031 Right lower quadrant pain: Secondary | ICD-10-CM | POA: Insufficient documentation

## 2024-03-16 DIAGNOSIS — O99511 Diseases of the respiratory system complicating pregnancy, first trimester: Secondary | ICD-10-CM | POA: Insufficient documentation

## 2024-03-16 DIAGNOSIS — R1030 Lower abdominal pain, unspecified: Secondary | ICD-10-CM

## 2024-03-16 DIAGNOSIS — Z3A01 Less than 8 weeks gestation of pregnancy: Secondary | ICD-10-CM | POA: Diagnosis not present

## 2024-03-16 DIAGNOSIS — D72829 Elevated white blood cell count, unspecified: Secondary | ICD-10-CM | POA: Insufficient documentation

## 2024-03-16 DIAGNOSIS — O99891 Other specified diseases and conditions complicating pregnancy: Secondary | ICD-10-CM | POA: Insufficient documentation

## 2024-03-16 DIAGNOSIS — O99111 Other diseases of the blood and blood-forming organs and certain disorders involving the immune mechanism complicating pregnancy, first trimester: Secondary | ICD-10-CM | POA: Insufficient documentation

## 2024-03-16 DIAGNOSIS — J45909 Unspecified asthma, uncomplicated: Secondary | ICD-10-CM | POA: Insufficient documentation

## 2024-03-16 DIAGNOSIS — R112 Nausea with vomiting, unspecified: Secondary | ICD-10-CM

## 2024-03-16 DIAGNOSIS — R197 Diarrhea, unspecified: Secondary | ICD-10-CM | POA: Diagnosis not present

## 2024-03-16 DIAGNOSIS — O219 Vomiting of pregnancy, unspecified: Secondary | ICD-10-CM | POA: Diagnosis present

## 2024-03-16 DIAGNOSIS — Z7951 Long term (current) use of inhaled steroids: Secondary | ICD-10-CM | POA: Insufficient documentation

## 2024-03-16 DIAGNOSIS — R1032 Left lower quadrant pain: Secondary | ICD-10-CM | POA: Diagnosis not present

## 2024-03-16 DIAGNOSIS — O26891 Other specified pregnancy related conditions, first trimester: Secondary | ICD-10-CM | POA: Insufficient documentation

## 2024-03-16 LAB — CBC
HCT: 36.9 % (ref 36.0–46.0)
Hemoglobin: 12.7 g/dL (ref 12.0–15.0)
MCH: 29.5 pg (ref 26.0–34.0)
MCHC: 34.4 g/dL (ref 30.0–36.0)
MCV: 85.6 fL (ref 80.0–100.0)
Platelets: 347 10*3/uL (ref 150–400)
RBC: 4.31 MIL/uL (ref 3.87–5.11)
RDW: 13.3 % (ref 11.5–15.5)
WBC: 11 10*3/uL — ABNORMAL HIGH (ref 4.0–10.5)
nRBC: 0 % (ref 0.0–0.2)

## 2024-03-16 LAB — LIPASE, BLOOD: Lipase: 27 U/L (ref 11–51)

## 2024-03-16 LAB — PREGNANCY, URINE: Preg Test, Ur: POSITIVE — AB

## 2024-03-16 LAB — URINALYSIS, ROUTINE W REFLEX MICROSCOPIC
Bilirubin Urine: NEGATIVE
Glucose, UA: NEGATIVE mg/dL
Hgb urine dipstick: NEGATIVE
Ketones, ur: 15 mg/dL — AB
Leukocytes,Ua: NEGATIVE
Nitrite: NEGATIVE
Protein, ur: NEGATIVE mg/dL
Specific Gravity, Urine: >=1.03 (ref 1.005–1.030)
pH: 5.5 (ref 5.0–8.0)

## 2024-03-16 LAB — COMPREHENSIVE METABOLIC PANEL WITH GFR
ALT: 24 U/L (ref 0–44)
AST: 19 U/L (ref 15–41)
Albumin: 3.6 g/dL (ref 3.5–5.0)
Alkaline Phosphatase: 58 U/L (ref 38–126)
Anion gap: 11 (ref 5–15)
BUN: 9 mg/dL (ref 6–20)
CO2: 20 mmol/L — ABNORMAL LOW (ref 22–32)
Calcium: 8.5 mg/dL — ABNORMAL LOW (ref 8.9–10.3)
Chloride: 103 mmol/L (ref 98–111)
Creatinine, Ser: 0.49 mg/dL (ref 0.44–1.00)
GFR, Estimated: >60 mL/min (ref >60)
Glucose, Bld: 104 mg/dL — ABNORMAL HIGH (ref 70–99)
Potassium: 3.6 mmol/L (ref 3.5–5.1)
Sodium: 134 mmol/L — ABNORMAL LOW (ref 135–145)
Total Bilirubin: 0.5 mg/dL (ref 0.0–1.2)
Total Protein: 7.2 g/dL (ref 6.5–8.1)

## 2024-03-16 LAB — HCG, QUANTITATIVE, PREGNANCY: hCG, Beta Chain, Quant, S: 56425 m[IU]/mL — ABNORMAL HIGH (ref <5)

## 2024-03-16 MED ORDER — SODIUM CHLORIDE 0.9 % IV BOLUS
1000.0000 mL | Freq: Once | INTRAVENOUS | Status: AC
  Administered 2024-03-16: 1000 mL via INTRAVENOUS

## 2024-03-16 MED ORDER — ACETAMINOPHEN 500 MG PO TABS
1000.0000 mg | ORAL_TABLET | Freq: Once | ORAL | Status: AC
  Administered 2024-03-16: 1000 mg via ORAL
  Filled 2024-03-16 (×2): qty 2

## 2024-03-16 MED ORDER — METOCLOPRAMIDE HCL 5 MG/ML IJ SOLN
10.0000 mg | Freq: Once | INTRAMUSCULAR | Status: AC
  Administered 2024-03-16: 10 mg via INTRAVENOUS
  Filled 2024-03-16: qty 2

## 2024-03-16 MED ORDER — FENTANYL CITRATE PF 50 MCG/ML IJ SOSY
25.0000 ug | PREFILLED_SYRINGE | Freq: Once | INTRAMUSCULAR | Status: AC
  Administered 2024-03-16: 25 ug via INTRAVENOUS
  Filled 2024-03-16: qty 1

## 2024-03-16 NOTE — Discharge Instructions (Addendum)
 You were evaluated the emergency room for abdominal pain, nausea, vomiting and diarrhea.  Your lab work showed a very mildly elevated white blood cell count.  Please follow-up with your OB tomorrow.

## 2024-03-16 NOTE — ED Provider Notes (Signed)
 [place] EMERGENCY DE[facility]MENT AT [facility] Provider Note   CSN: [id number] Arrival date & time: 03/16/24  1326     History  Chief Complaint  Patient presents with  . Abdominal Pain  . Emesis    Brooke [name] is a 38 y.o. female G3P1 [redacted] weeks gestation with history of diverticulitis presents with complaints of nausea, vomiting and diarrhea that started early this morning.  Additionally endorses some lower abdominal pain.  States her husband had similar symptoms a few days ago.  States she has not had issues with morning sickness thus far, perhaps only some nausea.  Denies any vaginal or urinary symptoms.  No prior abdominal surgeries.   Abdominal Pain Associated symptoms: vomiting   Emesis Associated symptoms: abdominal pain    Past Medical History:  Diagnosis Date  . Anxiety   . Asthma   . Migraines        Home Medications Prior to Admission medications   Medication Sig Start Date End Date Taking? Authorizing Provider  DULoxetine  (CYMBALTA ) 30 MG capsule Take 1 capsule (30 mg total) by mouth 2 (two) times daily. 02/26/23   Boscia, Heather E, NP  fluticasone (FLONASE) 50 MCG/ACT nasal spray Place into both nostrils daily.    [provider]  gabapentin  (NEURONTIN ) 100 MG capsule Take 1 capsule (100 mg total) by mouth at bedtime. 01/29/23   Sharyon Deis, NP  hydrOXYzine  (VISTARIL ) 25 MG capsule Take 1 capsule (25 mg total) by mouth daily as needed. 01/29/23   Boscia, Heather E, NP  ibuprofen  (ADVIL ,MOTRIN ) 800 MG tablet Take 1 tablet (800 mg total) by mouth 3 (three) times daily. 08/10/16   Defrancesco, India Mandes, MD      Allergies    Hydrocodone    Review of Systems   Review of Systems  Gastrointestinal:  Positive for abdominal pain and vomiting.    Physical Exam Updated Vital Signs BP (!) 139/97 (BP Location: Left Arm)   Pulse (!) 122   Temp 98.9 F (37.2 C) (Oral)   Resp 18   Ht 5\' 3"  (1.6 m)   Wt 87.5 kg   LMP 01/25/2024  (Exact Date)   SpO2 100%   BMI 34.19 kg/m  Physical Exam Vitals and nursing note reviewed.  Constitutional:      General: She is not in acute distress.    Appearance: She is well-developed.  HENT:     Head: Normocephalic and atraumatic.  Eyes:     Conjunctiva/sclera: Conjunctivae normal.  Cardiovascular:     Rate and Rhythm: Normal rate and regular rhythm.     Heart sounds: No murmur heard. Pulmonary:     Effort: Pulmonary effort is normal. No respiratory distress.     Breath sounds: Normal breath sounds.  Abdominal:     Palpations: Abdomen is soft.     Tenderness: There is no abdominal tenderness.     Comments: Tenderness noted to right lower quadrant and left lower quadrant.  No rebound or guarding, negative CVAT  Musculoskeletal:        General: No swelling.     Cervical back: Neck supple.  Skin:    General: Skin is warm and dry.     Capillary Refill: Capillary refill takes less than 2 seconds.  Neurological:     Mental Status: She is alert.  Psychiatric:        Mood and Affect: Mood normal.    ED Results / Procedures / Treatments   Labs (all labs ordered  are listed, but only abnormal results are displayed) Labs Reviewed  COMPREHENSIVE METABOLIC PANEL WITH GFR - Abnormal; Notable for the following components:      Result Value   Sodium 134 (*)    CO2 20 (*)    Glucose, Bld 104 (*)    Calcium 8.5 (*)    All other components within normal limits  CBC - Abnormal; Notable for the following components:   WBC 11.0 (*)    All other components within normal limits  URINALYSIS, ROUTINE W REFLEX MICROSCOPIC - Abnormal; Notable for the following components:   Ketones, ur 15 (*)    All other components within normal limits  PREGNANCY, URINE - Abnormal; Notable for the following components:   Preg Test, Ur POSITIVE (*)    All other components within normal limits  HCG, QUANTITATIVE, PREGNANCY - Abnormal; Notable for the following components:   hCG, Beta Chain, Quant, S  78,295 (*)    All other components within normal limits  LIPASE, BLOOD    EKG None  Radiology No results found.  Procedures Procedures    Medications Ordered in ED Medications  acetaminophen  (TYLENOL ) tablet 1,000 mg (0 mg Oral Hold 03/16/24 1609)  metoCLOPramide  (REGLAN ) injection 10 mg (has no administration in time range)  sodium chloride  0.9 % bolus 1,000 mL (1,000 mLs Intravenous New Bag/Given 03/16/24 1524)  fentaNYL  (SUBLIMAZE ) injection 25 mcg (25 mcg Intravenous Given 03/16/24 1604)    ED Course/ Medical Decision Making/ A&P                                 Medical Decision Making Amount and/or Complexity of Data Reviewed Labs: ordered.   This patient presents to the ED with chief complaint(s) of abdominal pain, vomiting and diarrhea.  The complaint involves an extensive differential diagnosis and also carries with it a high risk of complications and morbidity.   pertinent past medical history as listed in HPI  The differential diagnosis includes  gastroenteritis, colitis, small bowel obstruction, appendicitis, cholecystitis, hepatobiliary pathology, gastritis, PUD, ACS, aortic dissection, diverticulosis/diverticulitis, pancreatitis, nephrolithiasis, AAA, UTI, pyelonephritis, ruptured ectopic pregnancy, PI.   Additional history obtained: Records reviewed Care Everywhere/External Records  Initial Assessment:   Patient presents tachycardic to 122 with complaints of nausea, vomiting diarrhea and some abdominal pain.  On exam she has tenderness to the left lower quadrant right lower quadrant, appears to be worse on the right.  She does have a history of diverticulitis.  She states this does feel similar, although her husband few days ago had similar symptoms as well.  She is [redacted] weeks gestation.  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  Denies any vaginal bleeding or discharge.  No urinary symptoms.  Her labs she does have a mild leukocytosis of 11  Independent ECG  interpretation:  none  Independent labs interpretation:  The following labs were independently interpreted:  hCG 56, 4-5, lipase within normal limits, hCG positive, CBC with leukocytosis of 11, CMP without significant abnormality, UA with some ketones, negative nitrites, negative leukocytes  Independent visualization and interpretation of imaging: I independently visualized the following imaging with scope of interpretation limited to determining acute life threatening conditions related to emergency care:  pelvic US , which revealed single live IUP, estimated gestation of 6 weeks and 6 days  Treatment and Reassessment: Patient given Tylenol  1000 mg following first assessment and a liter bolus of normal saline With persistent symptoms, patient given  a dose of fentanyl    On reassessment patient's symptoms are somewhat improved. Discussed that MRI here today is limited and would not be able to adequately evaluate for appendicitis or diverticulitis. Will give Tylenol  and Reglan  and reassess.  If symptoms persist or worsening would discuss transfer to [facility] or [facility] for MRI.  Upon reexamination patient does report some improvement of her symptoms.  Will p.o. challenge as she is interested in discharge home.  Upon reevaluation patient is tolerating p.o. and feels that she can go home.  She does have an appointment with her OB tomorrow.   Noted to still be tachycardic to 114.  Will give another bolus of fluids before discharge.   Consultations obtained:   none  Disposition:   Patient will be discharged home.  The patient has been appropriately medically screened and/or stabilized in the ED. I have low suspicion for any other emergent medical condition which would require further screening, evaluation or treatment in the ED or require inpatient management. At time of discharge the patient is hemodynamically stable and in no acute distress. I have discussed work-up results and diagnosis with  patient and answered all questions. Patient is agreeable with discharge plan. We discussed strict return precautions for returning to the emergency department and they verbalized understanding.     Social Determinants of Health:   none  This note was dictated with voice recognition software.  Despite best efforts at proofreading, errors may have occurred which can change the documentation meaning.           Final Clinical Impression(s) / ED Diagnoses Final diagnoses:  Nausea vomiting and diarrhea  Lower abdominal pain    Rx / DC Orders ED Discharge Orders     None         Felicie Horning, PA-C 04/02/24 1604    Hershel Los, MD 04/07/24 1513

## 2024-03-16 NOTE — ED Notes (Signed)
Pt to [place] and MRI

## 2024-03-16 NOTE — ED Triage Notes (Addendum)
 Pt reports 7w pregnant, reports n/v/d since this morning with lower ABD/back pain, hx of diverticulitis

## 2024-03-18 ENCOUNTER — Inpatient Hospital Stay (HOSPITAL_COMMUNITY)
Admission: AD | Admit: 2024-03-18 | Discharge: 2024-03-18 | Disposition: A | Discharge status: Home / Self Care | Attending: Obstetrics and Gynecology | Admitting: Obstetrics and Gynecology

## 2024-03-18 DIAGNOSIS — O3411 Maternal care for benign tumor of corpus uteri, first trimester: Secondary | ICD-10-CM | POA: Diagnosis not present

## 2024-03-18 DIAGNOSIS — D259 Leiomyoma of uterus, unspecified: Secondary | ICD-10-CM | POA: Insufficient documentation

## 2024-03-18 DIAGNOSIS — R112 Nausea with vomiting, unspecified: Secondary | ICD-10-CM | POA: Diagnosis present

## 2024-03-18 DIAGNOSIS — R1030 Lower abdominal pain, unspecified: Secondary | ICD-10-CM | POA: Diagnosis present

## 2024-03-18 DIAGNOSIS — O219 Vomiting of pregnancy, unspecified: Secondary | ICD-10-CM | POA: Diagnosis not present

## 2024-03-18 DIAGNOSIS — M545 Low back pain, unspecified: Secondary | ICD-10-CM | POA: Diagnosis present

## 2024-03-18 DIAGNOSIS — Z3A01 Less than 8 weeks gestation of pregnancy: Secondary | ICD-10-CM | POA: Insufficient documentation

## 2024-03-18 LAB — URINALYSIS, ROUTINE W REFLEX MICROSCOPIC
Bilirubin Urine: NEGATIVE
Glucose, UA: NEGATIVE mg/dL
Hgb urine dipstick: NEGATIVE
Ketones, ur: NEGATIVE mg/dL
Leukocytes,Ua: NEGATIVE
Nitrite: NEGATIVE
Protein, ur: NEGATIVE mg/dL
Specific Gravity, Urine: 1.019 (ref 1.005–1.030)
pH: 5 (ref 5.0–8.0)

## 2024-03-18 MED ORDER — ONDANSETRON HCL 4 MG PO TABS: 8.0000 mg | ORAL_TABLET | Freq: Two times a day (BID) | ORAL | 0 refills | Status: DC

## 2024-03-18 MED ORDER — FAMOTIDINE 20 MG PO TABS: 20.0000 mg | ORAL_TABLET | Freq: Two times a day (BID) | ORAL | 0 refills | Status: AC | PRN

## 2024-03-18 MED ORDER — SCOPOLAMINE 1 MG/3DAYS TD PT72
1.0000 | MEDICATED_PATCH | TRANSDERMAL | 12 refills | Status: DC
Start: 2024-03-18 — End: 2024-10-11

## 2024-03-18 NOTE — Discharge Instructions (Signed)

## 2024-03-18 NOTE — MAU Provider Note (Addendum)
 [name] is a 38 y.o. G3P0010 patient who presents to MAU today with complaint of severe N/V and inability to tolerate PO Food. She was seen in [facility] ED on 03/16/24 and they confirmed an IUP pregnancy but she reports that since her discharge from ED she has still been having N/V and she has Reglan, Unison and B6 which give her minimal relief. She is still nausea but last vomited at 0430. She is a patient of [facility]  She denies any VB, LOF, or lower abdominal cramping at this time requesting antiemetics for the N/V   Review of Systems  All other systems reviewed and are negative.  Unless noted in HPI  O BP 125/84 (BP Location: Right Arm)   Pulse 93   Temp 99.1 F (37.3 C) (Oral)   Resp 17   Ht 5\' 3"  (1.6 m)   Wt 87.7 kg   LMP 01/25/2024 (Exact Date)   SpO2 100%   BMI 34.24 kg/m  Physical Exam Vitals and nursing note reviewed.  Constitutional:      General: She is not in acute distress.    Appearance: She is well-developed. She is not ill-appearing or diaphoretic.  HENT:     Head: Normocephalic.  Cardiovascular:     Rate and Rhythm: Normal rate and regular rhythm.  Pulmonary:     Effort: Pulmonary effort is normal.     Breath sounds: Normal breath sounds.  Abdominal:     Palpations: Abdomen is soft.  Skin:    General: Skin is warm.  Neurological:     Mental Status: She is alert and oriented to person, place, and time.  Psychiatric:        Mood and Affect: Mood normal.        Behavior: Behavior normal.     Orders Placed This Encounter  Procedures   Urinalysis, Routine w reflex microscopic -Urine, Clean Catch    Standing Status:   Standing    Number of Occurrences:   1    Specimen Source:   Urine, Clean Catch [76]   Discharge patient Discharge disposition: 01-Home or Self Care; Discharge patient date: 03/18/2024    Standing Status:   Standing    Number of Occurrences:   1    Discharge disposition:   01-Home or Self Care [1]    Discharge patient  date:   03/18/2024    Results for orders placed or performed during the hospital encounter of 03/18/24 (from the past 24 hours)  Urinalysis, Routine w reflex microscopic -Urine, Clean Catch     Status: Abnormal   Collection Time: 03/18/24  3:10 PM  Result Value Ref Range   Color, Urine YELLOW YELLOW   APPearance HAZY (A) CLEAR   Specific Gravity, Urine 1.019 1.005 - 1.030   pH 5.0 5.0 - 8.0   Glucose, UA NEGATIVE NEGATIVE mg/dL   Hgb urine dipstick NEGATIVE NEGATIVE   Bilirubin Urine NEGATIVE NEGATIVE   Ketones, ur NEGATIVE NEGATIVE mg/dL   Protein, ur NEGATIVE NEGATIVE mg/dL   Nitrite NEGATIVE NEGATIVE   Leukocytes,Ua NEGATIVE NEGATIVE     Narrative & Impression  CLINICAL DATA:  Pelvic pain. LMP: 01/25/2024 corresponding to an estimated gestational age of [redacted] weeks, 2 days.   EXAM: OBSTETRIC <14 WK [place] AND TRANSVAGINAL OB US   TECHNIQUE: Both transabdominal and transvaginal ultrasound examinations were performed for complete evaluation of the gestation as well as the maternal uterus, adnexal regions, and pelvic cul-de-sac. Transvaginal technique was  performed to assess early pregnancy.   COMPARISON:  None Available.   FINDINGS: Intrauterine gestational sac: Single intrauterine gestational sac.   Yolk sac:  Seen   Embryo:  Present   Cardiac Activity: Detected   Heart Rate: 135 bpm   CRL:  9 mm   6 w   6 d                  [place] EDC: 11/03/2024   Subchorionic hemorrhage:  None visualized.   Maternal uterus/adnexae: The ovaries are unremarkable. There is a corpus luteum in the right ovary. There is a 2.3 x 2.3 x 2.5 cm left body fibroid.   Small free fluid in the pelvis.   IMPRESSION: 1. Single live intrauterine pregnancy with an estimated gestational age of [redacted] weeks, 6 days by ultrasound. 2. Left uterine body fibroid and a corpus luteum in the right ovary.     Electronically Signed   By: Elgie Collard M.D.   On: 03/16/2024 17:11        ASSESSMENT Medical  screening exam complete  1. Nausea and vomiting during pregnancy (Primary)  2. [redacted] weeks gestation of pregnancy    PLAN  Discharge from MAU in stable condition Patient given the option of transfer to [facility] for further evaluation or seek care in outpatient facility of choice  List of options for follow-up given  Warning signs for worsening condition that would warrant emergency follow-up discussed Patient may return to MAU as needed   Meds ordered this encounter  Medications   ondansetron (ZOFRAN) 4 MG tablet    Sig: Take 2 tablets (8 mg total) by mouth 2 (two) times daily.    Dispense:  20 tablet    Refill:  0    Supervising Provider:   Reva Bores [[id number]]   scopolamine (TRANSDERM-SCOP) 1 MG/3DAYS    Sig: Place 1 patch (1.5 mg total) onto the skin every 3 (three) days.    Dispense:  10 patch    Refill:  12    Supervising Provider:   Reva Bores [[id number]]   famotidine (PEPCID) 20 MG tablet    Sig: Take 1 tablet (20 mg total) by mouth 2 (two) times daily as needed for heartburn or indigestion.    Dispense:  20 tablet    Refill:  0    Supervising Provider:   Reva Bores [[id number]]     Discharge from MAU in stable condition Patient given the option of transfer to [facility] for further evaluation or seek care in outpatient facility of choice  List of options for follow-up given  Warning signs for worsening condition that would warrant emergency follow-up discussed Patient may return to MAU as needed   Colman Cater, NP 03/18/2024 3:08 PM

## 2024-03-18 NOTE — MAU Note (Signed)
.  Brooke [name] is a 38 y.o. at [redacted]w[redacted]d here in MAU reporting: since Sunday she has had nausea and vomiting. Was seen at [facility] on Sunday and given ivf's and meds. Also reports she began having lower abd and lower back pain at the same time the N/V started. Reports she was discharged from there and the n/v has continued. Last time she vomited was at 0430 today but the nausea is constant. Took Reglan, Unisom, and B6 at 0800. These helped the nausea some. States the pain from Sunday is a lot better. Denies vaginal bleeding.   Onset of complaint: 2 days ago Pain score: 3/10 Vitals:   03/18/24 1459  BP: 125/84  Pulse: 93  Resp: 17  Temp: 99.1 F (37.3 C)  SpO2: 100%     FHT: na  Lab orders placed from triage: ua

## 2024-04-11 DIAGNOSIS — Z3481 Encounter for supervision of other normal pregnancy, first trimester: Secondary | ICD-10-CM | POA: Diagnosis not present

## 2024-04-11 DIAGNOSIS — O09291 Supervision of pregnancy with other poor reproductive or obstetric history, first trimester: Secondary | ICD-10-CM | POA: Diagnosis not present

## 2024-04-11 DIAGNOSIS — Z3401 Encounter for supervision of normal first pregnancy, first trimester: Secondary | ICD-10-CM | POA: Diagnosis not present

## 2024-05-26 ENCOUNTER — Encounter (HOSPITAL_COMMUNITY): Payer: Self-pay | Admitting: *Deleted

## 2024-05-26 ENCOUNTER — Inpatient Hospital Stay (HOSPITAL_COMMUNITY)
Admission: AD | Admit: 2024-05-26 | Discharge: 2024-05-26 | Disposition: A | Discharge status: Home / Self Care | Attending: Family Medicine | Admitting: Family Medicine

## 2024-05-26 DIAGNOSIS — R519 Headache, unspecified: Secondary | ICD-10-CM | POA: Diagnosis present

## 2024-05-26 DIAGNOSIS — Z3A17 17 weeks gestation of pregnancy: Secondary | ICD-10-CM | POA: Diagnosis not present

## 2024-05-26 DIAGNOSIS — O10912 Unspecified pre-existing hypertension complicating pregnancy, second trimester: Secondary | ICD-10-CM | POA: Diagnosis not present

## 2024-05-26 DIAGNOSIS — F411 Generalized anxiety disorder: Secondary | ICD-10-CM | POA: Diagnosis not present

## 2024-05-26 DIAGNOSIS — O99342 Other mental disorders complicating pregnancy, second trimester: Secondary | ICD-10-CM | POA: Insufficient documentation

## 2024-05-26 DIAGNOSIS — F410 Panic disorder [episodic paroxysmal anxiety] without agoraphobia: Secondary | ICD-10-CM | POA: Diagnosis present

## 2024-05-26 DIAGNOSIS — F419 Anxiety disorder, unspecified: Secondary | ICD-10-CM | POA: Diagnosis not present

## 2024-05-26 DIAGNOSIS — O10012 Pre-existing essential hypertension complicating pregnancy, second trimester: Secondary | ICD-10-CM | POA: Diagnosis not present

## 2024-05-26 DIAGNOSIS — R030 Elevated blood-pressure reading, without diagnosis of hypertension: Secondary | ICD-10-CM | POA: Diagnosis present

## 2024-05-26 DIAGNOSIS — F3289 Other specified depressive episodes: Secondary | ICD-10-CM

## 2024-05-26 DIAGNOSIS — O10919 Unspecified pre-existing hypertension complicating pregnancy, unspecified trimester: Secondary | ICD-10-CM | POA: Insufficient documentation

## 2024-05-26 MED ORDER — HYDROXYZINE PAMOATE 25 MG PO CAPS: 25.0000 mg | ORAL_CAPSULE | Freq: Three times a day (TID) | ORAL | 3 refills | Status: AC | PRN

## 2024-05-26 NOTE — Progress Notes (Addendum)
 Chief Complaint: Panic Attack, Headache, and Abdominal Pain    SUBJECTIVE HPI: Brooke [name] is a 38 y.o. G3P0010 at [redacted]w[redacted]d by early ultrasound who presents to maternity admissions reporting anxiety, HA.  Had panic attack earlier today from overall stress. Has been worried about how her health affects her baby. Started on Zoloft in May, feels anxiety may have worsened since then. Duloxetine  previously was helpful. Hydroxyzine  was previously helpful as needed. Has not tried individual therapy, is open to talk therapy. Some passive SI, no active SI/HI.   Has history of high blood pressure, never been on medications. Has some headache and vision blurriness that started earlier today, has been improving. No extremity swelling or difficulty breathing. Has hx of asthma, has not had to use rescue inhaler any time recently. She denies vaginal bleeding, LOF, contractions.    Past Medical History:  Diagnosis Date   Anxiety    Asthma    Migraines    Past Surgical History:  Procedure Laterality Date   AXILLARY LYMPH NODE BIOPSY Right    benign   DILATION AND EVACUATION N/A 08/10/2016   Procedure: DILATATION AND EVACUATION;  Surgeon: Colan Dash, MD;  Location: [facility] ORS;  Service: Gynecology;  Laterality: N/A;   KNEE SURGERY Right    repaired   TONSILLECTOMY     Social History   Socioeconomic History   Marital status: Divorced    Spouse name: Not on file   Number of children: Not on file   Years of education: Not on file   Highest education level: Not on file  Occupational History   Not on file  Tobacco Use   Smoking status: Never   Smokeless tobacco: Never  Vaping Use   Vaping status: Never Used  Substance and Sexual Activity   Alcohol use: Yes    Comment: before pregnancy   Drug use: No   Sexual activity: Yes    Partners: Male    Birth control/protection: None  Other Topics Concern   Not on file  Social History Narrative   Not on file   Social Drivers of  Health   Financial Resource Strain: Not on file  Food Insecurity: Not on file  Transportation Needs: Not on file  Physical Activity: Not on file  Stress: Not on file  Social Connections: Not on file  Intimate Partner Violence: Not on file   No current facility-administered medications on file prior to encounter.   Current Outpatient Medications on File Prior to Encounter  Medication Sig Dispense Refill   DULoxetine  (CYMBALTA ) 30 MG capsule Take 1 capsule (30 mg total) by mouth 2 (two) times daily. 60 capsule 2   famotidine  (PEPCID ) 20 MG tablet Take 1 tablet (20 mg total) by mouth 2 (two) times daily as needed for heartburn or indigestion. 20 tablet 0   fluticasone (FLONASE) 50 MCG/ACT nasal spray Place into both nostrils daily.     ondansetron  (ZOFRAN ) 4 MG tablet Take 2 tablets (8 mg total) by mouth 2 (two) times daily. 20 tablet 0   scopolamine  (TRANSDERM-SCOP) 1 MG/3DAYS Place 1 patch (1.5 mg total) onto the skin every 3 (three) days. 10 patch 12   Allergies  Allergen Reactions   Hydrocodone Palpitations    ROS:  Pertinent positives/negatives listed above.  I have reviewed patient's Past Medical Hx, Surgical Hx, Family Hx, Social Hx, medications and allergies.   Physical Exam  Patient Vitals for the past 24 hrs:  BP Temp Temp src Pulse Resp SpO2 Height Weight  05/26/24 1613 (!) 123/90 -- -- (!) 105 -- -- -- --  05/26/24 1609 (!) 138/100 98.9 F (37.2 C) Oral (!) 106 20 100 % 5' 3 (1.6 m) 90.6 kg  05/26/24 1607 -- -- -- -- -- 100 % -- --   Constitutional: Well-developed, well-nourished female Cardiovascular: RRR. Warm and well-perfused.  Respiratory: Breathing comfortably on room air. CTAB.  MS: Extremities with significant edema, erythema, tenderness. Normal distal pulses bilaterally.  Neurologic: Alert and cooperative.  Psych: Tearful at times  LAB RESULTS No results found for this or any previous visit (from the past 24 hours).     IMAGING No results  found.  MAU Management/MDM: Orders Placed This Encounter  Procedures   Discharge patient Discharge disposition: 01-Home or Self Care; Discharge patient date: 05/26/2024    Meds ordered this encounter  Medications   hydrOXYzine  (VISTARIL ) 25 MG capsule    Sig: Take 1 capsule (25 mg total) by mouth every 8 (eight) hours as needed.    Dispense:  30 capsule    Refill:  3    ASSESSMENT  Anxiety/Depression Patient on sertraline 25 daily since May. Symptoms not well-controlled at this time. No active SI/HI.  - Discussed increasing sertraline to 50 daily with close OB provider follow up next week.   - Discussed and ordered Hydroxyzine  q8 prn  - Discussed talk therapy   Elevated BP  Mildly elevated BP today, not consistently in hypertensive range. Repeat BP at time of encounter 136/89. Per chart review, has history of mild elevation. Never been on anti-hypertensive meds.   - Discussed continuing to follow up with OB provider   PLAN Discharge home with strict return precautions. Allergies as of 05/26/2024       Reactions   Hydrocodone Palpitations        Medication List     STOP taking these medications    DULoxetine  30 MG capsule Commonly known as: CYMBALTA        TAKE these medications    famotidine  20 MG tablet Commonly known as: Pepcid  Take 1 tablet (20 mg total) by mouth 2 (two) times daily as needed for heartburn or indigestion.   fluticasone 50 MCG/ACT nasal spray Commonly known as: FLONASE Place into both nostrils daily.   hydrOXYzine  25 MG capsule Commonly known as: VISTARIL  Take 1 capsule (25 mg total) by mouth every 8 (eight) hours as needed. What changed: when to take this   ondansetron  4 MG tablet Commonly known as: Zofran  Take 2 tablets (8 mg total) by mouth 2 (two) times daily.   scopolamine  1 MG/3DAYS Commonly known as: TRANSDERM-SCOP Place 1 patch (1.5 mg total) onto the skin every 3 (three) days.        Carey Chapman, MD 05/26/2024  8:08 PM

## 2024-05-26 NOTE — Discharge Instructions (Addendum)
 Thank you for visiting us  today - we are always here for you!  We discussed the following: - Try increasing your Zoloft dose to 50mg  daily (2 tablets) and plan to check in with your OB provider next week.  - You may use Hydroxyzine  every 8 hours as needed for anxiety/panic.  - Explore talk therapy options - you can always call your insurance provider to determine which therapists may be covered by your plan.   We hope you are feeling better soon - you deserve it!

## 2024-05-26 NOTE — MAU Note (Signed)
 Brooke [name] is a 38 y.o. at [redacted]w[redacted]d here in MAU reporting: had a panic attack, was afraid her BP was getting too high, she got dizzy and could feel her pulse really beating.   Since then her ligament has been more pronounced and she has had a HA. Did not take anything for HA.  No bleeding or d/c.  Started on Zoloft May 23. Onset of complaint: noon Pain score: HA 6, abd 6 Vitals:   05/26/24 1609 05/26/24 1613  BP: (!) 138/100 (!) 123/90  Pulse: (!) 106 (!) 105  Resp: 20   Temp: 98.9 F (37.2 C)   SpO2: 100%      FHT:150 Lab orders placed from triage:

## 2024-06-20 DIAGNOSIS — Z36 Encounter for antenatal screening for chromosomal anomalies: Secondary | ICD-10-CM | POA: Diagnosis not present

## 2024-06-26 DIAGNOSIS — Z3490 Encounter for supervision of normal pregnancy, unspecified, unspecified trimester: Secondary | ICD-10-CM | POA: Diagnosis not present

## 2024-07-10 ENCOUNTER — Encounter: Payer: Self-pay | Admitting: Emergency Medicine

## 2024-07-10 ENCOUNTER — Ambulatory Visit
Admission: EM | Admit: 2024-07-10 | Discharge: 2024-07-10 | Disposition: A | Discharge status: Home / Self Care | Attending: Family Medicine | Admitting: Family Medicine

## 2024-07-10 DIAGNOSIS — J4521 Mild intermittent asthma with (acute) exacerbation: Secondary | ICD-10-CM

## 2024-07-10 DIAGNOSIS — Z3400 Encounter for supervision of normal first pregnancy, unspecified trimester: Secondary | ICD-10-CM | POA: Insufficient documentation

## 2024-07-10 MED ORDER — ALBUTEROL SULFATE HFA 108 (90 BASE) MCG/ACT IN AERS: 1.0000 | INHALATION_SPRAY | Freq: Four times a day (QID) | RESPIRATORY_TRACT | 1 refills | Status: AC | PRN

## 2024-07-10 NOTE — ED Triage Notes (Addendum)
 Pt reports intermittent shortness of breath and issues with asthma x2 weeks. She needs refill of her inhaler and does not have a PCP right now. Breathing is regular and unlabored. Pulse ox: 96-97% in triage. Pt is [redacted] weeks pregnant.

## 2024-07-10 NOTE — ED Provider Notes (Signed)
 [facility]   [id number] 07/10/24 Arrival Time: 1004  ASSESSMENT & PLAN:  1. Mild intermittent asthma with acute exacerbation    Without resp distress.  Meds ordered this encounter  Medications   albuterol  (VENTOLIN  HFA) 108 (90 Base) MCG/ACT inhaler    Sig: Inhale 1-2 puffs into the lungs every 6 (six) hours as needed for wheezing or shortness of breath.    Dispense:  1 each    Refill:  1   Asthma precautions given. OTC symptom care as needed.  Recommend:  Follow-up Information     Hanford Powell BRAVO, NP.   Specialty: Family Medicine Why: As needed. Contact information: [address] [place] [address] [phone]         [facility]  [facility]).   Specialty: Urgent Care Why: If worsening or failing to improve as anticipated. Contact information: [address] [place]  [phone]                Reviewed expectations re: course of current medical issues. Questions answered. Outlined signs and symptoms indicating need for more acute intervention. Patient verbalized understanding. After Visit Summary given.  SUBJECTIVE: History from: patient.  Analilia [name] is a 38 y.o. female who presents with complaint of intermittent wheezing over past few weeks; out of albuterol  which does help. Not having to use daily. Denies CP/SOB. Is 24w pregnant.  Social History   Tobacco Use  Smoking Status Never   Passive exposure: Never  Smokeless Tobacco Never   OBJECTIVE:  Vitals:   07/10/24 1036  BP: 124/85  Pulse: (!) 110  Resp: 20  Temp: 98.5 F (36.9 C)  TempSrc: Oral  SpO2: 96%    Tachycardia noted. General appearance: alert; NAD HEENT: [place]; AT; without nasal congestion Neck: supple without LAD Cv: RRR without murmer Lungs: unlabored respirations, mild bilateral expiratory wheezing; cough: absent; no significant respiratory distress Skin: warm  and dry Psychological: alert and cooperative; normal mood and affect  Imaging: No results found.  Allergies  Allergen Reactions   Hydrocodone Palpitations and Other (See Comments)    Elevated heart rate  Elevated heart rate  Elevated heart rate, Elevated heart rate    Past Medical History:  Diagnosis Date   Anxiety    Asthma    Migraines    Family History  Problem Relation Age of Onset   Hypertension Mother    Migraines Mother    Diabetes Maternal Grandmother    Osteoarthritis Maternal Grandmother    Throat cancer Maternal Grandmother    Cancer Maternal Grandmother    Cancer Maternal Grandfather    Social History   Socioeconomic History   Marital status: Divorced    Spouse name: Not on file   Number of children: Not on file   Years of education: Not on file   Highest education level: Not on file  Occupational History   Not on file  Tobacco Use   Smoking status: Never    Passive exposure: Never   Smokeless tobacco: Never  Vaping Use   Vaping status: Never Used  Substance and Sexual Activity   Alcohol use: Not Currently    Comment: before pregnancy   Drug use: No   Sexual activity: Yes    Partners: Male    Birth control/protection: None  Other Topics Concern   Not on file  Social History Narrative   Not on file   Social Drivers of Health  Financial Resource Strain: Not on file  Food Insecurity: Not on file  Transportation Needs: Not on file  Physical Activity: Not on file  Stress: Not on file  Social Connections: Not on file  Intimate Partner Violence: Not on file             Rolinda Rogue, MD 07/10/24 1124

## 2024-08-08 DIAGNOSIS — O24419 Gestational diabetes mellitus in pregnancy, unspecified control: Secondary | ICD-10-CM

## 2024-08-08 HISTORY — DX: Gestational diabetes mellitus in pregnancy, unspecified control: O24.419

## 2024-08-08 LAB — OB RESULTS CONSOLE HEPATITIS B SURFACE ANTIGEN: Hepatitis B Surface Ag: NEGATIVE

## 2024-08-08 LAB — OB RESULTS CONSOLE RPR: RPR: NONREACTIVE

## 2024-08-08 LAB — HEPATITIS C ANTIBODY: HCV Ab: NEGATIVE

## 2024-08-08 LAB — OB RESULTS CONSOLE RUBELLA ANTIBODY, IGM: Rubella: IMMUNE

## 2024-08-08 LAB — OB RESULTS CONSOLE ANTIBODY SCREEN: Antibody Screen: NEGATIVE

## 2024-08-08 LAB — OB RESULTS CONSOLE GC/CHLAMYDIA
Chlamydia: NEGATIVE
Neisseria Gonorrhea: NEGATIVE

## 2024-08-08 LAB — OB RESULTS CONSOLE HIV ANTIBODY (ROUTINE TESTING): HIV: NONREACTIVE

## 2024-08-14 DIAGNOSIS — Z3480 Encounter for supervision of other normal pregnancy, unspecified trimester: Secondary | ICD-10-CM | POA: Diagnosis not present

## 2024-08-21 ENCOUNTER — Encounter: Admitting: Dietician

## 2024-08-21 ENCOUNTER — Encounter: Payer: Self-pay | Admitting: Dietician

## 2024-08-21 DIAGNOSIS — O24410 Gestational diabetes mellitus in pregnancy, diet controlled: Secondary | ICD-10-CM | POA: Diagnosis present

## 2024-08-21 NOTE — Progress Notes (Signed)
 Patient was seen for Gestational Diabetes on 08/21/2024  Start time 0955 and End time 1045   Estimated due date: 11/01/2024; [redacted]w[redacted]d girl  Clinical: Medications: aspirin, prenatal vitamin, magnesium, ALA, probiotic, pepcid , vitamin D, Omega 3 Medical History: GFDM Labs: OGTT fasting 95, 1 hour 187, 2 hour 170 on 3 hour 138 on 08/08/2024 on 08/08/2024, A1c none  Dietary and Lifestyle History: Patient lives with her partner and his daughter.  He is doing the cooking and shopping currently.  This is her first child.  She works as a Museum/gallery conservator.  Physical Activity: work related Stress: moderate to high Sleep: moderate  24 hr Recall:  First Meal:  banana Snack:   Second meal:  [employer] slider Snack:  none Third meal:  t[employer] bell catina bowl (chicken, rice, beans) Snack:  none Beverages:  water, zero Dr. Nunzio (1/2-1), mineral water, coffee with sweetened creamer  NUTRITION INTERVENTION  Nutrition education (E-1) on the following topics:   Initial Follow-up  [x]  []  Definition of Gestational Diabetes [x]  []  Why dietary management is important in controlling blood glucose [x]  []  Effects each nutrient has on blood glucose levels [x]  []  Simple carbohydrates vs complex carbohydrates [x]  []  Fluid intake [x]  []  Creating a balanced meal plan [x]  []  Carbohydrate counting  [x]  []  When to check blood glucose levels [x]  []  Proper blood glucose monitoring techniques [x]  []  Effect of stress and stress reduction techniques  [x]  []  Exercise effect on blood glucose levels, appropriate exercise during pregnancy [x]  []  Importance of limiting caffeine and abstaining from alcohol and smoking [x]  []  Medications used for blood sugar control during pregnancy [x]  []  Hypoglycemia and rule of 15 [x]  []  Postpartum self care  Patient has an AccuChek meter and Free Style Libre Fasting:  85 this am Post meal:  variable  CGM Results from download: 08/21/2024  % Time CGM active:   39 %   (Goal >70%)  Average  glucose:   94 mg/dL for 14 days  Glucose management indicator:   N/a %  Time in range (70-180 mg/dL):   99 %   (Goal >29%)  Time High (181-250 mg/dL):   0 %   (Goal < 74%)  Time Very High (>250 mg/dL):    0 %   (Goal < 5%)  Time Low (54-69 mg/dL):   1 %   (Goal <5%)  Time Very Low (<54 mg/dL):    %   (Goal <8%)       Patient instructed to monitor glucose levels: FBS: 60 - <= 95 mg/dL; 2 hour: <= 879 mg/dL  Patient received handouts: Nutrition Diabetes and Pregnancy Carbohydrate Counting List Blood glucose log Snack ideas for diabetes during pregnancy  Patient will be seen for follow-up as needed.

## 2024-09-11 DIAGNOSIS — O24419 Gestational diabetes mellitus in pregnancy, unspecified control: Secondary | ICD-10-CM | POA: Diagnosis not present

## 2024-09-11 DIAGNOSIS — Z3A32 32 weeks gestation of pregnancy: Secondary | ICD-10-CM | POA: Diagnosis not present

## 2024-09-24 DIAGNOSIS — Z3A35 35 weeks gestation of pregnancy: Secondary | ICD-10-CM | POA: Diagnosis not present

## 2024-09-24 DIAGNOSIS — O24414 Gestational diabetes mellitus in pregnancy, insulin controlled: Secondary | ICD-10-CM | POA: Diagnosis not present

## 2024-09-25 DIAGNOSIS — O24414 Gestational diabetes mellitus in pregnancy, insulin controlled: Secondary | ICD-10-CM | POA: Diagnosis not present

## 2024-09-25 DIAGNOSIS — Z3A35 35 weeks gestation of pregnancy: Secondary | ICD-10-CM | POA: Diagnosis not present

## 2024-09-30 DIAGNOSIS — O24415 Gestational diabetes mellitus in pregnancy, controlled by oral hypoglycemic drugs: Secondary | ICD-10-CM | POA: Diagnosis not present

## 2024-09-30 DIAGNOSIS — Z3A35 35 weeks gestation of pregnancy: Secondary | ICD-10-CM | POA: Diagnosis not present

## 2024-10-02 ENCOUNTER — Other Ambulatory Visit: Payer: Self-pay | Admitting: Family Medicine

## 2024-10-02 DIAGNOSIS — F411 Generalized anxiety disorder: Secondary | ICD-10-CM

## 2024-10-03 DIAGNOSIS — O24415 Gestational diabetes mellitus in pregnancy, controlled by oral hypoglycemic drugs: Secondary | ICD-10-CM | POA: Diagnosis not present

## 2024-10-03 DIAGNOSIS — Z3A36 36 weeks gestation of pregnancy: Secondary | ICD-10-CM | POA: Diagnosis not present

## 2024-10-03 DIAGNOSIS — Z3480 Encounter for supervision of other normal pregnancy, unspecified trimester: Secondary | ICD-10-CM | POA: Diagnosis not present

## 2024-10-03 LAB — OB RESULTS CONSOLE GBS: GBS: NEGATIVE

## 2024-10-06 DIAGNOSIS — O24415 Gestational diabetes mellitus in pregnancy, controlled by oral hypoglycemic drugs: Secondary | ICD-10-CM | POA: Diagnosis not present

## 2024-10-06 DIAGNOSIS — Z3A36 36 weeks gestation of pregnancy: Secondary | ICD-10-CM | POA: Diagnosis not present

## 2024-10-08 DIAGNOSIS — O24415 Gestational diabetes mellitus in pregnancy, controlled by oral hypoglycemic drugs: Secondary | ICD-10-CM | POA: Diagnosis not present

## 2024-10-08 DIAGNOSIS — O24419 Gestational diabetes mellitus in pregnancy, unspecified control: Secondary | ICD-10-CM | POA: Diagnosis not present

## 2024-10-08 DIAGNOSIS — Z3A36 36 weeks gestation of pregnancy: Secondary | ICD-10-CM | POA: Diagnosis not present

## 2024-10-08 DIAGNOSIS — O09523 Supervision of elderly multigravida, third trimester: Secondary | ICD-10-CM | POA: Diagnosis not present

## 2024-10-11 ENCOUNTER — Other Ambulatory Visit: Payer: Self-pay

## 2024-10-11 ENCOUNTER — Encounter (HOSPITAL_COMMUNITY): Payer: Self-pay | Admitting: Obstetrics

## 2024-10-11 ENCOUNTER — Inpatient Hospital Stay (HOSPITAL_COMMUNITY)
Admission: AD | Admit: 2024-10-11 | Discharge: 2024-10-11 | Disposition: A | Discharge status: Home / Self Care | Attending: Obstetrics | Admitting: Obstetrics

## 2024-10-11 DIAGNOSIS — R519 Headache, unspecified: Secondary | ICD-10-CM

## 2024-10-11 DIAGNOSIS — O09523 Supervision of elderly multigravida, third trimester: Secondary | ICD-10-CM | POA: Diagnosis not present

## 2024-10-11 DIAGNOSIS — O368130 Decreased fetal movements, third trimester, not applicable or unspecified: Secondary | ICD-10-CM

## 2024-10-11 DIAGNOSIS — O26893 Other specified pregnancy related conditions, third trimester: Secondary | ICD-10-CM | POA: Insufficient documentation

## 2024-10-11 DIAGNOSIS — R030 Elevated blood-pressure reading, without diagnosis of hypertension: Secondary | ICD-10-CM | POA: Diagnosis not present

## 2024-10-11 DIAGNOSIS — O24419 Gestational diabetes mellitus in pregnancy, unspecified control: Secondary | ICD-10-CM

## 2024-10-11 DIAGNOSIS — Z3689 Encounter for other specified antenatal screening: Secondary | ICD-10-CM | POA: Diagnosis not present

## 2024-10-11 DIAGNOSIS — Z3A37 37 weeks gestation of pregnancy: Secondary | ICD-10-CM

## 2024-10-11 LAB — CBC
HCT: 31 % — ABNORMAL LOW (ref 36.0–46.0)
Hemoglobin: 10.2 g/dL — ABNORMAL LOW (ref 12.0–15.0)
MCH: 28 pg (ref 26.0–34.0)
MCHC: 32.9 g/dL (ref 30.0–36.0)
MCV: 85.2 fL (ref 80.0–100.0)
Platelets: 293 K/uL (ref 150–400)
RBC: 3.64 MIL/uL — ABNORMAL LOW (ref 3.87–5.11)
RDW: 15.3 % (ref 11.5–15.5)
WBC: 8 K/uL (ref 4.0–10.5)
nRBC: 0 % (ref 0.0–0.2)

## 2024-10-11 LAB — COMPREHENSIVE METABOLIC PANEL WITH GFR
ALT: 21 U/L (ref 0–44)
AST: 18 U/L (ref 15–41)
Albumin: 2.3 g/dL — ABNORMAL LOW (ref 3.5–5.0)
Alkaline Phosphatase: 110 U/L (ref 38–126)
Anion gap: 11 (ref 5–15)
BUN: 7 mg/dL (ref 6–20)
CO2: 20 mmol/L — ABNORMAL LOW (ref 22–32)
Calcium: 8.8 mg/dL — ABNORMAL LOW (ref 8.9–10.3)
Chloride: 106 mmol/L (ref 98–111)
Creatinine, Ser: 0.51 mg/dL (ref 0.44–1.00)
GFR, Estimated: >60 mL/min (ref >60)
Glucose, Bld: 112 mg/dL — ABNORMAL HIGH (ref 70–99)
Potassium: 3.8 mmol/L (ref 3.5–5.1)
Sodium: 137 mmol/L (ref 135–145)
Total Bilirubin: 0.4 mg/dL (ref 0.0–1.2)
Total Protein: 5.8 g/dL — ABNORMAL LOW (ref 6.5–8.1)

## 2024-10-11 LAB — PROTEIN / CREATININE RATIO, URINE
Creatinine, Urine: 101 mg/dL
Protein Creatinine Ratio: 0.17 mg/mg{creat} — ABNORMAL HIGH (ref 0.00–0.15)
Total Protein, Urine: 17 mg/dL

## 2024-10-11 MED ORDER — FERROUS SULFATE 325 (65 FE) MG PO TABS: 325.0000 mg | ORAL_TABLET | Freq: Every day | ORAL | 0 refills | Status: AC

## 2024-10-11 NOTE — MAU Provider Note (Signed)
 Chief Complaint:  Hypertension and Decreased Fetal Movement   HPI      Brooke [name] is a 38 y.o. G3P0010 at [redacted]w[redacted]d who presents to maternity admissions reporting that she is woke up this AM and starting feeling a little dizzy with a mild HA and visual floaters which prompted her to take her BP which was elevated at  157/101 and  repeat of 140/93. Reports she has not taken anything for her HA 4/10 on pain scale and reports that she has no RUQ pain, N/V, SOB, or CP. Patient also concerned today for less FM's although she does feel movements.   She also reported that she is scheduled for an elective C- Section ( Maternal preference) Does not want to labor. Last ate at 0930 this AM.  Pregnancy Course: Brooke [name] ( Followed for GDM - A2 has insulin for night prn)  Past Medical History:  Diagnosis Date   Anxiety    Asthma    GDM (gestational diabetes mellitus) 08/08/2024   Migraines    OB History  Gravida Para Term Preterm AB Living  3    1   SAB IAB Ectopic Multiple Live Births  1        # Outcome Date GA Lbr Len/2nd Weight Sex Type Anes PTL Lv  3 Current           2 SAB 12/07/15        ND  1 Gravida            Past Surgical History:  Procedure Laterality Date   AXILLARY LYMPH NODE BIOPSY Right    benign   DILATION AND EVACUATION N/A 08/10/2016   Procedure: DILATATION AND EVACUATION;  Surgeon: Gladis DELENA Dollar, MD;  Location: [facility] ORS;  Service: Gynecology;  Laterality: N/A;   KNEE SURGERY Right    repaired   TONSILLECTOMY     Family History  Problem Relation Age of Onset   Hypertension Mother    Migraines Mother    Diabetes Maternal Grandmother    Osteoarthritis Maternal Grandmother    Throat cancer Maternal Grandmother    Cancer Maternal Grandmother    Cancer Maternal Grandfather    Social History   Tobacco Use   Smoking status: Never    Passive exposure: Never   Smokeless tobacco: Never  Vaping Use   Vaping status: Never Used  Substance Use Topics    Alcohol use: Not Currently    Comment: before pregnancy   Drug use: No   Allergies  Allergen Reactions   Hydrocodone Palpitations and Other (See Comments)    Elevated heart rate  Elevated heart rate  Elevated heart rate, Elevated heart rate   Medications Prior to Admission  Medication Sig Dispense Refill Last Dose/Taking   acetaminophen  (TYLENOL ) 500 MG tablet 1 tablet as needed Orally every 6 hrs   10/10/2024   albuterol  (VENTOLIN  HFA) 108 (90 Base) MCG/ACT inhaler Inhale 1-2 puffs into the lungs every 6 (six) hours as needed for wheezing or shortness of breath. 1 each 1 Past Week   Alpha-Lipoic Acid 600 MG CAPS Take 600 mg by mouth.   10/10/2024   aspirin 81 MG chewable tablet    10/10/2024   Cholecalciferol (VITAMIN D3) 25 MCG (1000 UT) CAPS 1 capsule.   10/10/2024   famotidine  (PEPCID ) 20 MG tablet Take 1 tablet (20 mg total) by mouth 2 (two) times daily as needed for heartburn or indigestion. 20 tablet 0 10/10/2024   hydrOXYzine  (VISTARIL ) 25 MG capsule  Take 1 capsule (25 mg total) by mouth every 8 (eight) hours as needed. 30 capsule 3 10/10/2024   insulin aspart (NOVOLOG FLEXPEN) 100 UNIT/ML FlexPen Inject 12 Units into the skin at bedtime.   Past Week   Magnesium Glycinate 100 MG CAPS 1 tablets Orally once a day   10/10/2024   magnesium oxide (MAG-OX) 400 MG tablet Take 400 mg by mouth.   10/10/2024   omeprazole (PRILOSEC) 20 MG capsule 1 capsule.   10/10/2024   Prenatal 28-0.8 MG TABS 1 tablet Orally Once a day   10/10/2024   pyridoxine (B-6) 100 MG tablet 1 tablet Orally Once a day   10/10/2024   sertraline (ZOLOFT) 25 MG tablet 1 tablet Orally Once a day; Duration: 90 days   10/10/2024   Adapalene-Benzoyl Peroxide 0.1-2.5 % gel Apply topically daily. (Patient not taking: Reported on 07/10/2024)      doxylamine, Sleep, (UNISOM SLEEPTABS) 25 MG tablet 1 tablet at bedtime as needed Orally Once a day (Patient not taking: Reported on 07/10/2024)      fluticasone (FLONASE) 50  MCG/ACT nasal spray Place into both nostrils daily. (Patient not taking: Reported on 07/10/2024)      gabapentin  (NEURONTIN ) 300 MG capsule 1 capsule every day by oral route. (Patient not taking: Reported on 07/10/2024)      guanFACINE (INTUNIV) 2 MG TB24 ER tablet Take 2 mg by mouth. (Patient not taking: Reported on 07/10/2024)      HYDROcodone-acetaminophen  (NORCO/VICODIN) 5-325 MG tablet 1-2 tablets, every 4-6 hours, by mouth,  as needed for pain, for 5 days. NOT TO EXCEED 10 TABLETS A DAY (Patient not taking: Reported on 07/10/2024)      hydrOXYzine  (ATARAX ) 10 MG tablet Indications: anxious (Patient not taking: Reported on 07/10/2024)      ketorolac  (TORADOL ) 10 MG tablet 1 tablet every 8 hours by oral route. (Patient not taking: Reported on 07/10/2024)      meloxicam (MOBIC) 15 MG tablet Take 15 mg by mouth. (Patient not taking: Reported on 07/10/2024)      methocarbamol (ROBAXIN) 500 MG tablet Take 1 tablet every 6-8 hours by oral route as needed for spasm for 10 days. (Patient not taking: Reported on 07/10/2024)      mometasone furoate (ASMANEX, 30 METERED DOSES,) 110 MCG/ACT AEPB Inhale 1 puff into the lungs. (Patient not taking: Reported on 07/10/2024)      Montelukast Sodium (SINGULAIR PO)  (Patient not taking: Reported on 07/10/2024)      Naproxen (NAPROSYN PO)  (Patient not taking: Reported on 07/10/2024)      Omega-3 Fatty Acids (FISH OIL) 1000 MG CAPS Take 1 g by mouth. (Patient not taking: Reported on 07/10/2024)   Not Taking   ondansetron  (ZOFRAN ) 4 MG tablet Take 2 tablets (8 mg total) by mouth 2 (two) times daily. 20 tablet 0 More than a month   Oyster Shell Calcium 500 MG TABS 1 tablet with meals Orally Twice a day (Patient not taking: Reported on 07/10/2024)      predniSONE (DELTASONE) 10 MG tablet 1 dose pk by oral route. (Patient not taking: Reported on 07/10/2024)      saccharomyces boulardii (FLORASTOR) 250 MG capsule 1 capsule.      scopolamine  (TRANSDERM-SCOP) 1 MG/3DAYS Place 1 patch  (1.5 mg total) onto the skin every 3 (three) days. (Patient not taking: Reported on 07/10/2024) 10 patch 12    Zinc  100 MG TABS 1 tablet Orally Once a day (Patient not taking: Reported on 07/10/2024)  I have reviewed patient's Past Medical Hx, Surgical Hx, Family Hx, Social Hx, medications and allergies.   ROS  Pertinent items noted in HPI and remainder of comprehensive ROS otherwise negative.   PHYSICAL EXAM  Patient Vitals for the past 24 hrs:  BP Temp Temp src Pulse Resp SpO2  10/11/24 1330 115/63 -- -- (!) 102 -- 95 %  10/11/24 1325 -- -- -- -- -- 98 %  10/11/24 1320 -- -- -- -- -- 96 %  10/11/24 1315 104/64 -- -- (!) 101 -- 96 %  10/11/24 1300 (!) 109/57 -- -- (!) 102 -- 98 %  10/11/24 1255 -- -- -- -- -- 97 %  10/11/24 1250 -- -- -- -- -- 96 %  10/11/24 1247 102/60 -- -- 96 -- --  10/11/24 1230 127/83 -- -- (!) 115 -- 98 %  10/11/24 1215 127/82 -- -- (!) 108 -- 98 %  10/11/24 1202 (!) 141/85 97.7 F (36.5 C) Oral (!) 113 16 --    Constitutional: Well-developed, obese female in no acute distress.  Cardiovascular: normal rate & rhythm, warm and well-perfused Respiratory: normal effort, no problems with respiration noted GI: Abd soft, non-tender, gravid, No ruq pain illicit on palpation MS: Extremities nontender, no edema, normal ROM Neurologic: Alert and oriented x 4.  GU: no CVA tenderness Pelvic:Deferred      Fetal Tracing: NST Reactive Baseline: 140 Variability:moderate  Accelerations: present Decelerations: absent Toco: UI   Labs: Results for orders placed or performed during the hospital encounter of 10/11/24 (from the past 24 hours)  Protein / creatinine ratio, urine     Status: Abnormal   Collection Time: 10/11/24 12:16 PM  Result Value Ref Range   Creatinine, Urine 101 mg/dL   Total Protein, Urine 17 mg/dL   Protein Creatinine Ratio 0.17 (H) 0.00 - 0.15 mg/mg[Cre]  CBC     Status: Abnormal   Collection Time: 10/11/24 12:24 PM  Result Value Ref Range    WBC 8.0 4.0 - 10.5 K/uL   RBC 3.64 (L) 3.87 - 5.11 MIL/uL   Hemoglobin 10.2 (L) 12.0 - 15.0 g/dL   HCT 68.9 (L) 63.9 - 53.9 %   MCV 85.2 80.0 - 100.0 fL   MCH 28.0 26.0 - 34.0 pg   MCHC 32.9 30.0 - 36.0 g/dL   RDW 84.6 88.4 - 84.4 %   Platelets 293 150 - 400 K/uL   nRBC 0.0 0.0 - 0.2 %  Comprehensive metabolic panel     Status: Abnormal   Collection Time: 10/11/24 12:24 PM  Result Value Ref Range   Sodium 137 135 - 145 mmol/L   Potassium 3.8 3.5 - 5.1 mmol/L   Chloride 106 98 - 111 mmol/L   CO2 20 (L) 22 - 32 mmol/L   Glucose, Bld 112 (H) 70 - 99 mg/dL   BUN 7 6 - 20 mg/dL   Creatinine, Ser 9.48 0.44 - 1.00 mg/dL   Calcium 8.8 (L) 8.9 - 10.3 mg/dL   Total Protein 5.8 (L) 6.5 - 8.1 g/dL   Albumin 2.3 (L) 3.5 - 5.0 g/dL   AST 18 15 - 41 U/L   ALT 21 0 - 44 U/L   Alkaline Phosphatase 110 38 - 126 U/L   Total Bilirubin 0.4 0.0 - 1.2 mg/dL   GFR, Estimated >39 >39 mL/min   Anion gap 11 5 - 15     MDM & MAU COURSE  MDM:  HIGH  Prenatal chart review Physical exam CBC: Hgb 10.2 (  Plan for RX at discharge) CMP: NM P/C Ratio: 0.17 BP Monitoring:  141/85 initially , now all normotensive  @ 1333 patient reassessed and reports her HA has resolved and she declined Tylenol  We discussed normal labs and no evidence to suggest gHTN at this time  Brooke [name]  reports she has F/U appt on Monday 10/13/24 at OB's office for routine PNV and BP Check    Differential Diagnosis of Hypertension in pregnancy can be preexisting chronic hypertension, Gestational hypertension, preeclampsia, preeclampsia with Severe features, Chronic hypertension with superimposed Preeclampsia, HELLP Syndrome, all of these conditions can lead to Eclampsia, pulmonary edema, IUGR, and/or placenta abruption. Precise diagnosis is often challenging. High clinical suspicion is warranted given the increase in maternal and fetal-neonatal risks associated with preeclampsia.   MAU Course: Orders Placed This Encounter   Procedures   CBC   Comprehensive metabolic panel   Protein / creatinine ratio, urine   Measure blood pressure   Discharge patient Discharge disposition: 01-Home or Self Care; Discharge patient date: 10/11/2024   I have reviewed the patient chart and performed the physical exam . I have ordered & interpreted the lab results and reviewed and interpreted the NST Medications ordered as stated below.  A/P as described below.  Counseling and education provided and patient agreeable  with plan as described below. Verbalized understanding.    ASSESSMENT   1. Elevated blood pressure reading in office without diagnosis of hypertension   2. Nonintractable headache, unspecified chronicity pattern, unspecified headache type   3. [redacted] weeks gestation of pregnancy   4. Decreased fetal movements in third trimester, single or unspecified fetus   5. NST (non-stress test) reactive on fetal surveillance     PLAN  Discharge home in stable condition with return precautions  BP check in the office on 10/13/24  See AVS for full description of information given to the patient including both verbal and written. Patient verbalized understanding and agrees with the plan as described above.     Follow-up Information     Ob/Gyn, Brooke [name] Follow up on 10/13/2024.   Why: If symptoms worsen or fail to resolve, As scheduled for ongoing prenatal care, Blood pressure check Contact information: [address] [place] [address] [phone]                 Allergies as of 10/11/2024       Reactions   Hydrocodone Palpitations, Other (See Comments)   Elevated heart rate  Elevated heart rate Elevated heart rate, Elevated heart rate        Medication List     STOP taking these medications    Adapalene-Benzoyl Peroxide 0.1-2.5 % gel   Asmanex (30 Metered Doses) 110 MCG/ACT Aepb Generic drug: mometasone furoate   Fish Oil 1000 MG Caps   gabapentin  300 MG capsule Commonly  known as: NEURONTIN    guanFACINE 2 MG Tb24 ER tablet Commonly known as: INTUNIV   HYDROcodone-acetaminophen  5-325 MG tablet Commonly known as: NORCO/VICODIN   hydrOXYzine  10 MG tablet Commonly known as: ATARAX    ketorolac  10 MG tablet Commonly known as: TORADOL    meloxicam 15 MG tablet Commonly known as: MOBIC   methocarbamol 500 MG tablet Commonly known as: ROBAXIN   NAPROSYN PO   Oyster Shell Calcium 500 MG Tabs   predniSONE 10 MG tablet Commonly known as: DELTASONE   scopolamine  1 MG/3DAYS Commonly known as: TRANSDERM-SCOP   SINGULAIR PO   Unisom SleepTabs 25 MG tablet Generic drug: doxylamine (Sleep)  Zinc  100 MG Tabs       TAKE these medications    albuterol  108 (90 Base) MCG/ACT inhaler Commonly known as: VENTOLIN  HFA Inhale 1-2 puffs into the lungs every 6 (six) hours as needed for wheezing or shortness of breath.   Alpha-Lipoic Acid 600 MG Caps Take 600 mg by mouth.   aspirin 81 MG chewable tablet   famotidine  20 MG tablet Commonly known as: Pepcid  Take 1 tablet (20 mg total) by mouth 2 (two) times daily as needed for heartburn or indigestion.   ferrous sulfate 325 (65 FE) MG tablet Take 1 tablet (325 mg total) by mouth daily.   fluticasone 50 MCG/ACT nasal spray Commonly known as: FLONASE Place into both nostrils daily.   hydrOXYzine  25 MG capsule Commonly known as: VISTARIL  Take 1 capsule (25 mg total) by mouth every 8 (eight) hours as needed.   Magnesium Glycinate 100 MG Caps 1 tablets Orally once a day   magnesium oxide 400 MG tablet Commonly known as: MAG-OX Take 400 mg by mouth.   NovoLOG FlexPen 100 UNIT/ML FlexPen Generic drug: insulin aspart Inject 12 Units into the skin at bedtime.   omeprazole 20 MG capsule Commonly known as: PRILOSEC 1 capsule.   ondansetron  4 MG tablet Commonly known as: Zofran  Take 2 tablets (8 mg total) by mouth 2 (two) times daily.   Prenatal 28-0.8 MG Tabs 1 tablet Orally Once a day    pyridoxine 100 MG tablet Commonly known as: B-6 1 tablet Orally Once a day   saccharomyces boulardii 250 MG capsule Commonly known as: FLORASTOR 1 capsule.   sertraline 25 MG tablet Commonly known as: ZOLOFT 1 tablet Orally Once a day; Duration: 90 days   TYLENOL  500 MG tablet Generic drug: acetaminophen  1 tablet as needed Orally every 6 hrs   Vitamin D3 25 MCG (1000 UT) Caps 1 capsule.        Olam Dalton, MSN, [facility], [facility] for [facility]    This chart was dictated using voice recognition software, Dragon. Despite the best efforts of this provider to proofread and correct errors, errors may still occur which can change documentation meaning.

## 2024-10-11 NOTE — MAU Note (Signed)
 Brooke [name] is a 38 y.o. at [redacted]w[redacted]d here in MAU reporting: states her Bps have been normal so far throughout her pregnancy. Felt dizzy and had a headache this am so she checked her BP at home, readings were 157/101 & 140/93. Denies any CTXs, LOF or VB. Reports having some ligament pain but it is not consistent. Reports feeling less fetal movement than usual.  Pain score: 4 HA Vitals:   10/11/24 1202  BP: (!) 141/85  Pulse: (!) 113  Resp: 16  Temp: 97.7 F (36.5 C)     FHT:155 Lab orders placed from triage:

## 2024-10-13 ENCOUNTER — Telehealth (HOSPITAL_COMMUNITY): Payer: Self-pay | Admitting: *Deleted

## 2024-10-13 DIAGNOSIS — Z3A37 37 weeks gestation of pregnancy: Secondary | ICD-10-CM | POA: Diagnosis not present

## 2024-10-13 DIAGNOSIS — O24415 Gestational diabetes mellitus in pregnancy, controlled by oral hypoglycemic drugs: Secondary | ICD-10-CM | POA: Diagnosis not present

## 2024-10-13 DIAGNOSIS — O09523 Supervision of elderly multigravida, third trimester: Secondary | ICD-10-CM | POA: Diagnosis not present

## 2024-10-13 NOTE — Telephone Encounter (Signed)
 Preadmission screen

## 2024-10-14 ENCOUNTER — Telehealth (HOSPITAL_COMMUNITY): Payer: Self-pay | Admitting: *Deleted

## 2024-10-14 NOTE — Telephone Encounter (Signed)
 Preadmission screen

## 2024-10-15 ENCOUNTER — Telehealth (HOSPITAL_COMMUNITY): Payer: Self-pay | Admitting: *Deleted

## 2024-10-15 NOTE — Telephone Encounter (Signed)
 Preadmission screen

## 2024-10-16 ENCOUNTER — Telehealth (HOSPITAL_COMMUNITY): Payer: Self-pay | Admitting: *Deleted

## 2024-10-16 DIAGNOSIS — Z3A37 37 weeks gestation of pregnancy: Secondary | ICD-10-CM | POA: Diagnosis not present

## 2024-10-16 DIAGNOSIS — O24415 Gestational diabetes mellitus in pregnancy, controlled by oral hypoglycemic drugs: Secondary | ICD-10-CM | POA: Diagnosis not present

## 2024-10-16 DIAGNOSIS — O09523 Supervision of elderly multigravida, third trimester: Secondary | ICD-10-CM | POA: Diagnosis not present

## 2024-10-16 NOTE — Telephone Encounter (Signed)
 Preadmission screen

## 2024-10-17 ENCOUNTER — Encounter (HOSPITAL_COMMUNITY): Payer: Self-pay

## 2024-10-17 NOTE — Patient Instructions (Signed)
 [facility]  10/17/2024   Your procedure is scheduled on:  10/24/2024  Arrive at 0530 at Entrance C on [address] at [facility]  and [address]. You are invited to use the FREE valet parking or use the Visitor's parking deck.  Pick up the phone at the desk and dial [phone].  Call this number if you have problems the morning of surgery: [phone]  Remember:   Do not eat food:(After Midnight) Desps de medianoche.  You may drink clear liquids until  __0330___.  Clear liquids means a liquid you can see thru.  It can have color such as Cola or Kool aid.  Tea is OK and coffee as long as no milk or creamer of any kind.  Take these medicines the morning of surgery with A SIP OF WATER:  none   Do not wear jewelry, make-up or nail polish.  Do not wear lotions, powders, or perfumes. Do not wear deodorant.  Do not shave 48 hours prior to surgery.  Do not bring valuables to the hospital.  [facility] is not   responsible for any belongings or valuables brought to the hospital.  Contacts, dentures or bridgework may not be worn into surgery.  Leave suitcase in the car. After surgery it may be brought to your room.  For patients admitted to the hospital, checkout time is 11:00 AM the day of              discharge.      Please read over the following fact sheets that you were given:     Preparing for Surgery

## 2024-10-20 ENCOUNTER — Other Ambulatory Visit: Payer: Self-pay

## 2024-10-20 ENCOUNTER — Inpatient Hospital Stay (HOSPITAL_COMMUNITY): Admission: AD | Admit: 2024-10-20 | Discharge: 2024-10-20 | Disposition: A | Discharge status: Home / Self Care

## 2024-10-20 ENCOUNTER — Encounter (HOSPITAL_COMMUNITY): Payer: Self-pay

## 2024-10-20 DIAGNOSIS — Z3689 Encounter for other specified antenatal screening: Secondary | ICD-10-CM | POA: Insufficient documentation

## 2024-10-20 DIAGNOSIS — O368330 Maternal care for abnormalities of the fetal heart rate or rhythm, third trimester, not applicable or unspecified: Secondary | ICD-10-CM | POA: Diagnosis not present

## 2024-10-20 DIAGNOSIS — O24415 Gestational diabetes mellitus in pregnancy, controlled by oral hypoglycemic drugs: Secondary | ICD-10-CM | POA: Diagnosis not present

## 2024-10-20 DIAGNOSIS — Z3A38 38 weeks gestation of pregnancy: Secondary | ICD-10-CM

## 2024-10-20 DIAGNOSIS — O368390 Maternal care for abnormalities of the fetal heart rate or rhythm, unspecified trimester, not applicable or unspecified: Secondary | ICD-10-CM | POA: Diagnosis not present

## 2024-10-20 DIAGNOSIS — O99343 Other mental disorders complicating pregnancy, third trimester: Secondary | ICD-10-CM | POA: Diagnosis not present

## 2024-10-20 DIAGNOSIS — F32A Depression, unspecified: Secondary | ICD-10-CM | POA: Diagnosis not present

## 2024-10-20 DIAGNOSIS — O09523 Supervision of elderly multigravida, third trimester: Secondary | ICD-10-CM | POA: Diagnosis not present

## 2024-10-20 DIAGNOSIS — O99513 Diseases of the respiratory system complicating pregnancy, third trimester: Secondary | ICD-10-CM | POA: Diagnosis not present

## 2024-10-20 DIAGNOSIS — F419 Anxiety disorder, unspecified: Secondary | ICD-10-CM | POA: Insufficient documentation

## 2024-10-20 LAB — URINALYSIS, ROUTINE W REFLEX MICROSCOPIC
Bilirubin Urine: NEGATIVE
Glucose, UA: NEGATIVE mg/dL
Hgb urine dipstick: NEGATIVE
Ketones, ur: 20 mg/dL — AB
Nitrite: NEGATIVE
Protein, ur: 30 mg/dL — AB
Specific Gravity, Urine: 1.018 (ref 1.005–1.030)
pH: 5 (ref 5.0–8.0)

## 2024-10-20 NOTE — MAU Note (Signed)
 MAU Triage Note  Brooke [name] is a 38 y.o. at [redacted]w[redacted]d here in MAU reporting: reports she was sent from the office. She had a NST done today, and baby had a few decels. Sent for monitoring. Endorses +FM, denies VB and LOF.    Onset of complaint: 1500 Pain score: denies Vitals:   10/20/24 1610  BP: (!) 143/87  Pulse: 93  Resp: 17  Temp: 98.1 F (36.7 C)  SpO2: 98%     FHT: 143  Lab orders placed from triage: UA

## 2024-10-20 NOTE — MAU Provider Note (Signed)
 History     [id number]  Arrival date and time: 10/20/24 1540    Chief Complaint  Patient presents with   Failed NST     HPI Brooke [name] is a 38 y.o. at [redacted]w[redacted]d with PMHx notable for GDMA2, AMA, cHTN, anxiety, who presents for abnormal antenatal testing.   Review of outside prenatal records from [facility] (in media tab): besides GDMA2 uncomplicated prengancy, normotensive throughout, unremarkable labs (except for GTT), planning primary elective cesarean  Given report from office by Dr. Okey She reports patient was in for weekly NST and tracing was difficult to interpret with some portions fetal, some maternal, and some portions unclear if they were decels Also reports she has stopped taking insulin which was prescribed for her GDMA2 Sent to MAU for extended fetal monitoring  Patient reports no issues, she feels well Fetal movement is vigorous No bleeding or leaking fluid No contractions     OB History     Gravida  3   Para      Term      Preterm      AB  2   Living         SAB  2   IAB      Ectopic      Multiple      Live Births              Past Medical History:  Diagnosis Date   ADHD    Anxiety    Asthma    Depression    GDM (gestational diabetes mellitus) 08/08/2024   Gestational diabetes    Migraines     Past Surgical History:  Procedure Laterality Date   AXILLARY LYMPH NODE BIOPSY Right    benign   DILATION AND EVACUATION N/A 08/10/2016   Procedure: DILATATION AND EVACUATION;  Surgeon: Gladis DELENA Dollar, MD;  Location: [facility] ORS;  Service: Gynecology;  Laterality: N/A;   KNEE SURGERY Right    repaired   TONSILLECTOMY      Family History  Problem Relation Age of Onset   Hypertension Mother    Migraines Mother    Diabetes Maternal Grandmother    Osteoarthritis Maternal Grandmother    Throat cancer Maternal Grandmother    Cancer Maternal Grandmother    Cancer Maternal Grandfather     Social  History   Socioeconomic History   Marital status: Divorced    Spouse name: Not on file   Number of children: Not on file   Years of education: Not on file   Highest education level: Not on file  Occupational History   Not on file  Tobacco Use   Smoking status: Never    Passive exposure: Never   Smokeless tobacco: Never  Vaping Use   Vaping status: Never Used  Substance and Sexual Activity   Alcohol use: Not Currently    Comment: before pregnancy   Drug use: No   Sexual activity: Yes    Partners: Male    Birth control/protection: None  Other Topics Concern   Not on file  Social History Narrative   Not on file   Social Drivers of Health   Financial Resource Strain: Not on file  Food Insecurity: Not on file  Transportation Needs: Not on file  Physical Activity: Not on file  Stress: Not on file  Social Connections: Not on file  Intimate Partner Violence: Not on file    Allergies  Allergen Reactions   Hydrocodone Palpitations and Other (  See Comments)    Elevated heart rate  Elevated heart rate  Elevated heart rate, Elevated heart rate    No current facility-administered medications on file prior to encounter.   Current Outpatient Medications on File Prior to Encounter  Medication Sig Dispense Refill   acetaminophen  (TYLENOL ) 500 MG tablet 1 tablet as needed Orally every 6 hrs     albuterol  (VENTOLIN  HFA) 108 (90 Base) MCG/ACT inhaler Inhale 1-2 puffs into the lungs every 6 (six) hours as needed for wheezing or shortness of breath. 1 each 1   Alpha-Lipoic Acid 600 MG CAPS Take 600 mg by mouth.     aspirin 81 MG chewable tablet      Cholecalciferol (VITAMIN D3) 25 MCG (1000 UT) CAPS 1 capsule.     famotidine  (PEPCID ) 20 MG tablet Take 1 tablet (20 mg total) by mouth 2 (two) times daily as needed for heartburn or indigestion. 20 tablet 0   ferrous sulfate 325 (65 FE) MG tablet Take 1 tablet (325 mg total) by mouth daily. 30 tablet 0   fluticasone (FLONASE) 50  MCG/ACT nasal spray Place into both nostrils daily. (Patient not taking: Reported on 07/10/2024)     hydrOXYzine  (VISTARIL ) 25 MG capsule Take 1 capsule (25 mg total) by mouth every 8 (eight) hours as needed. 30 capsule 3   insulin aspart (NOVOLOG FLEXPEN) 100 UNIT/ML FlexPen Inject 12 Units into the skin at bedtime.     Magnesium Glycinate 100 MG CAPS 1 tablets Orally once a day     magnesium oxide (MAG-OX) 400 MG tablet Take 400 mg by mouth.     omeprazole (PRILOSEC) 20 MG capsule 1 capsule.     ondansetron  (ZOFRAN ) 4 MG tablet Take 2 tablets (8 mg total) by mouth 2 (two) times daily. 20 tablet 0   Prenatal 28-0.8 MG TABS 1 tablet Orally Once a day     pyridoxine (B-6) 100 MG tablet 1 tablet Orally Once a day     saccharomyces boulardii (FLORASTOR) 250 MG capsule 1 capsule.     sertraline (ZOLOFT) 25 MG tablet 1 tablet Orally Once a day; Duration: 90 days       ROS Pertinent positives and negative per HPI, all others reviewed and negative  Physical Exam   BP 121/76   Pulse (!) 104   Temp 98.1 F (36.7 C) (Oral)   Resp 17   Ht 5' 3 (1.6 m)   Wt 101.4 kg   LMP 01/25/2024 (Exact Date)   SpO2 99%   BMI 39.59 kg/m   Patient Vitals for the past 24 hrs:  BP Temp Temp src Pulse Resp SpO2 Height Weight  10/20/24 1645 121/76 -- -- (!) 104 -- 99 % -- --  10/20/24 1630 128/84 -- -- 99 -- 98 % -- --  10/20/24 1616 130/88 -- -- (!) 110 -- 99 % -- --  10/20/24 1610 (!) 143/87 98.1 F (36.7 C) Oral 93 17 98 % -- --  10/20/24 1555 -- -- -- -- -- -- 5' 3 (1.6 m) 101.4 kg    Physical Exam Vitals reviewed.  Constitutional:      General: She is not in acute distress.    Appearance: She is well-developed. She is not diaphoretic.  Eyes:     General: No scleral icterus. Pulmonary:     Effort: Pulmonary effort is normal. No respiratory distress.  Skin:    General: Skin is warm and dry.  Neurological:     Mental Status: She is alert.  Coordination: Coordination normal.       Cervical Exam    Bedside Ultrasound Not performed.  My interpretation: n/a  FHT Baseline: 130 bpm Variability: Good {> 6 bpm) Accelerations: Reactive Decelerations: Absent Uterine activity: occasional Cat: I  Labs Results for orders placed or performed during the hospital encounter of 10/20/24 (from the past 24 hours)  Urinalysis, Routine w reflex microscopic -Urine, Clean Catch     Status: Abnormal   Collection Time: 10/20/24  4:00 PM  Result Value Ref Range   Color, Urine YELLOW YELLOW   APPearance CLOUDY (A) CLEAR   Specific Gravity, Urine 1.018 1.005 - 1.030   pH 5.0 5.0 - 8.0   Glucose, UA NEGATIVE NEGATIVE mg/dL   Hgb urine dipstick NEGATIVE NEGATIVE   Bilirubin Urine NEGATIVE NEGATIVE   Ketones, ur 20 (A) NEGATIVE mg/dL   Protein, ur 30 (A) NEGATIVE mg/dL   Nitrite NEGATIVE NEGATIVE   Leukocytes,Ua LARGE (A) NEGATIVE   RBC / HPF 0-5 0 - 5 RBC/hpf   WBC, UA 6-10 0 - 5 WBC/hpf   Bacteria, UA MANY (A) NONE SEEN   Squamous Epithelial / HPF 11-20 0 - 5 /HPF   Mucus PRESENT     Imaging No results found.  MAU Course  Procedures Lab Orders         Urinalysis, Routine w reflex microscopic -Urine, Clean Catch    No orders of the defined types were placed in this encounter.  Imaging Orders  No imaging studies ordered today    MDM Moderate (Level 3-4)  Assessment and Plan  #Non-reassuring fetal status, antepartum #[redacted] weeks gestation of pregnancy NST Cat I and very reactive here in MAU, patient without any concerning symptoms, reassured patient.   #FWB FHT Cat I NST: Reactive   Dispo: discharged to home in stable condition    Donnice CHRISTELLA Carolus, MD/MPH 10/20/24 4:55 PM  Allergies as of 10/20/2024       Reactions   Hydrocodone Palpitations, Other (See Comments)   Elevated heart rate  Elevated heart rate Elevated heart rate, Elevated heart rate        Medication List     TAKE these medications    albuterol  108 (90 Base) MCG/ACT  inhaler Commonly known as: VENTOLIN  HFA Inhale 1-2 puffs into the lungs every 6 (six) hours as needed for wheezing or shortness of breath.   Alpha-Lipoic Acid 600 MG Caps Take 600 mg by mouth.   aspirin 81 MG chewable tablet   famotidine  20 MG tablet Commonly known as: Pepcid  Take 1 tablet (20 mg total) by mouth 2 (two) times daily as needed for heartburn or indigestion.   ferrous sulfate 325 (65 FE) MG tablet Take 1 tablet (325 mg total) by mouth daily.   fluticasone 50 MCG/ACT nasal spray Commonly known as: FLONASE Place into both nostrils daily.   hydrOXYzine  25 MG capsule Commonly known as: VISTARIL  Take 1 capsule (25 mg total) by mouth every 8 (eight) hours as needed.   Magnesium Glycinate 100 MG Caps 1 tablets Orally once a day   magnesium oxide 400 MG tablet Commonly known as: MAG-OX Take 400 mg by mouth.   NovoLOG FlexPen 100 UNIT/ML FlexPen Generic drug: insulin aspart Inject 12 Units into the skin at bedtime.   omeprazole 20 MG capsule Commonly known as: PRILOSEC 1 capsule.   ondansetron  4 MG tablet Commonly known as: Zofran  Take 2 tablets (8 mg total) by mouth 2 (two) times daily.   Prenatal 28-0.8 MG Tabs 1  tablet Orally Once a day   pyridoxine 100 MG tablet Commonly known as: B-6 1 tablet Orally Once a day   saccharomyces boulardii 250 MG capsule Commonly known as: FLORASTOR 1 capsule.   sertraline 25 MG tablet Commonly known as: ZOLOFT 1 tablet Orally Once a day; Duration: 90 days   TYLENOL  500 MG tablet Generic drug: acetaminophen  1 tablet as needed Orally every 6 hrs   Vitamin D3 25 MCG (1000 UT) Caps 1 capsule.

## 2024-10-22 ENCOUNTER — Encounter (HOSPITAL_COMMUNITY)
Admission: RE | Admit: 2024-10-22 | Discharge: 2024-10-22 | Disposition: A | Discharge status: Home / Self Care | Source: Ambulatory Visit | Attending: Obstetrics and Gynecology | Admitting: Obstetrics and Gynecology

## 2024-10-22 VITALS — Ht 63.0 in | Wt 221.0 lb

## 2024-10-22 DIAGNOSIS — Z3A38 38 weeks gestation of pregnancy: Secondary | ICD-10-CM | POA: Diagnosis not present

## 2024-10-22 DIAGNOSIS — Z01812 Encounter for preprocedural laboratory examination: Secondary | ICD-10-CM | POA: Insufficient documentation

## 2024-10-22 DIAGNOSIS — O09523 Supervision of elderly multigravida, third trimester: Secondary | ICD-10-CM | POA: Diagnosis not present

## 2024-10-22 DIAGNOSIS — Z3A39 39 weeks gestation of pregnancy: Secondary | ICD-10-CM | POA: Insufficient documentation

## 2024-10-22 DIAGNOSIS — O24415 Gestational diabetes mellitus in pregnancy, controlled by oral hypoglycemic drugs: Secondary | ICD-10-CM | POA: Diagnosis not present

## 2024-10-22 DIAGNOSIS — Z3403 Encounter for supervision of normal first pregnancy, third trimester: Secondary | ICD-10-CM | POA: Insufficient documentation

## 2024-10-22 HISTORY — DX: Attention-deficit hyperactivity disorder, unspecified type: F90.9

## 2024-10-22 HISTORY — DX: Gestational diabetes mellitus in pregnancy, unspecified control: O24.419

## 2024-10-22 HISTORY — DX: Depression, unspecified: F32.A

## 2024-10-22 LAB — TYPE AND SCREEN
ABO/RH(D): A POS
Antibody Screen: NEGATIVE

## 2024-10-22 LAB — RPR: RPR Ser Ql: NONREACTIVE

## 2024-10-22 LAB — CBC
HCT: 31.2 % — ABNORMAL LOW (ref 36.0–46.0)
Hemoglobin: 10.2 g/dL — ABNORMAL LOW (ref 12.0–15.0)
MCH: 29 pg (ref 26.0–34.0)
MCHC: 32.7 g/dL (ref 30.0–36.0)
MCV: 88.6 fL (ref 80.0–100.0)
Platelets: 248 K/uL (ref 150–400)
RBC: 3.52 MIL/uL — ABNORMAL LOW (ref 3.87–5.11)
RDW: 15.9 % — ABNORMAL HIGH (ref 11.5–15.5)
WBC: 7.1 K/uL (ref 4.0–10.5)
nRBC: 0 % (ref 0.0–0.2)

## 2024-10-24 ENCOUNTER — Inpatient Hospital Stay (HOSPITAL_COMMUNITY)
Admission: RE | Admit: 2024-10-24 | Discharge: 2024-10-28 | DRG: 784 | Disposition: A | Discharge status: Home / Self Care | Attending: Obstetrics and Gynecology | Admitting: Obstetrics and Gynecology

## 2024-10-24 ENCOUNTER — Encounter (HOSPITAL_COMMUNITY)
Admission: RE | Disposition: A | Discharge status: Home / Self Care | Payer: Self-pay | Source: Home / Self Care | Attending: Obstetrics and Gynecology

## 2024-10-24 ENCOUNTER — Inpatient Hospital Stay (HOSPITAL_COMMUNITY): Admitting: Anesthesiology

## 2024-10-24 ENCOUNTER — Encounter (HOSPITAL_COMMUNITY): Payer: Self-pay | Admitting: Obstetrics and Gynecology

## 2024-10-24 ENCOUNTER — Other Ambulatory Visit: Payer: Self-pay

## 2024-10-24 DIAGNOSIS — O99214 Obesity complicating childbirth: Secondary | ICD-10-CM | POA: Diagnosis not present

## 2024-10-24 DIAGNOSIS — E66813 Obesity, class 3: Secondary | ICD-10-CM | POA: Diagnosis present

## 2024-10-24 DIAGNOSIS — D62 Acute posthemorrhagic anemia: Secondary | ICD-10-CM | POA: Diagnosis not present

## 2024-10-24 DIAGNOSIS — Z8249 Family history of ischemic heart disease and other diseases of the circulatory system: Secondary | ICD-10-CM

## 2024-10-24 DIAGNOSIS — O24424 Gestational diabetes mellitus in childbirth, insulin controlled: Secondary | ICD-10-CM | POA: Diagnosis not present

## 2024-10-24 DIAGNOSIS — F32A Depression, unspecified: Secondary | ICD-10-CM | POA: Diagnosis not present

## 2024-10-24 DIAGNOSIS — Z3A39 39 weeks gestation of pregnancy: Secondary | ICD-10-CM | POA: Diagnosis not present

## 2024-10-24 DIAGNOSIS — Z3A00 Weeks of gestation of pregnancy not specified: Secondary | ICD-10-CM | POA: Diagnosis not present

## 2024-10-24 DIAGNOSIS — Z833 Family history of diabetes mellitus: Secondary | ICD-10-CM | POA: Diagnosis not present

## 2024-10-24 DIAGNOSIS — O99344 Other mental disorders complicating childbirth: Secondary | ICD-10-CM | POA: Diagnosis present

## 2024-10-24 DIAGNOSIS — O24414 Gestational diabetes mellitus in pregnancy, insulin controlled: Secondary | ICD-10-CM | POA: Diagnosis not present

## 2024-10-24 DIAGNOSIS — O9081 Anemia of the puerperium: Secondary | ICD-10-CM | POA: Diagnosis not present

## 2024-10-24 DIAGNOSIS — Z302 Encounter for sterilization: Secondary | ICD-10-CM

## 2024-10-24 DIAGNOSIS — J4520 Mild intermittent asthma, uncomplicated: Secondary | ICD-10-CM | POA: Diagnosis present

## 2024-10-24 DIAGNOSIS — F419 Anxiety disorder, unspecified: Secondary | ICD-10-CM | POA: Diagnosis present

## 2024-10-24 DIAGNOSIS — O134 Gestational [pregnancy-induced] hypertension without significant proteinuria, complicating childbirth: Secondary | ICD-10-CM | POA: Diagnosis present

## 2024-10-24 DIAGNOSIS — O9952 Diseases of the respiratory system complicating childbirth: Secondary | ICD-10-CM | POA: Diagnosis not present

## 2024-10-24 DIAGNOSIS — Z9889 Other specified postprocedural states: Principal | ICD-10-CM

## 2024-10-24 LAB — GLUCOSE, CAPILLARY
Glucose-Capillary: 91 mg/dL (ref 70–99)
Glucose-Capillary: 83 mg/dL (ref 70–99)

## 2024-10-24 SURGERY — Surgical Case
Anesthesia: Spinal | Laterality: Bilateral

## 2024-10-24 MED ORDER — SOD CITRATE-CITRIC ACID 500-334 MG/5ML PO SOLN
ORAL | Status: AC
  Filled 2024-10-24: qty 30

## 2024-10-24 MED ORDER — LACTATED RINGERS IV SOLN: INTRAVENOUS | Status: DC | PRN

## 2024-10-24 MED ORDER — ALBUTEROL SULFATE (2.5 MG/3ML) 0.083% IN NEBU: 2.5000 mg | INHALATION_SOLUTION | Freq: Four times a day (QID) | RESPIRATORY_TRACT | Status: DC | PRN

## 2024-10-24 MED ORDER — DIPHENHYDRAMINE HCL 50 MG/ML IJ SOLN
12.5000 mg | INTRAMUSCULAR | Status: DC | PRN
Start: 2024-10-24 — End: 2024-10-25

## 2024-10-24 MED ORDER — SIMETHICONE 80 MG PO CHEW
80.0000 mg | CHEWABLE_TABLET | ORAL | Status: DC | PRN
Start: 2024-10-24 — End: 2024-10-28

## 2024-10-24 MED ORDER — CEFAZOLIN SODIUM-DEXTROSE 2-4 GM/100ML-% IV SOLN
INTRAVENOUS | Status: AC
  Filled 2024-10-24: qty 100

## 2024-10-24 MED ORDER — SENNOSIDES-DOCUSATE SODIUM 8.6-50 MG PO TABS
2.0000 | ORAL_TABLET | Freq: Every day | ORAL | Status: DC
  Administered 2024-10-25 – 2024-10-28 (×4): 2 via ORAL
  Filled 2024-10-24 (×4): qty 2

## 2024-10-24 MED ORDER — FENTANYL CITRATE (PF) 100 MCG/2ML IJ SOLN: 25.0000 ug | INTRAMUSCULAR | Status: DC | PRN

## 2024-10-24 MED ORDER — DIBUCAINE (PERIANAL) 1 % EX OINT
1.0000 | TOPICAL_OINTMENT | CUTANEOUS | Status: DC | PRN
Start: 2024-10-24 — End: 2024-10-28

## 2024-10-24 MED ORDER — ACETAMINOPHEN 500 MG PO TABS: 1000.0000 mg | ORAL_TABLET | Freq: Four times a day (QID) | ORAL | Status: DC

## 2024-10-24 MED ORDER — ACETAMINOPHEN 500 MG PO TABS
1000.0000 mg | ORAL_TABLET | Freq: Four times a day (QID) | ORAL | Status: DC
  Administered 2024-10-24 (×2): 1000 mg via ORAL
  Filled 2024-10-24 (×3): qty 2

## 2024-10-24 MED ORDER — KETOROLAC TROMETHAMINE 30 MG/ML IJ SOLN
30.0000 mg | Freq: Four times a day (QID) | INTRAMUSCULAR | Status: DC | PRN
  Administered 2024-10-24: 30 mg via INTRAVENOUS

## 2024-10-24 MED ORDER — MONTELUKAST SODIUM 10 MG PO TABS
10.0000 mg | ORAL_TABLET | Freq: Every day | ORAL | Status: DC
  Administered 2024-10-25 – 2024-10-27 (×3): 10 mg via ORAL
  Filled 2024-10-24 (×4): qty 1

## 2024-10-24 MED ORDER — NALOXONE HCL 0.4 MG/ML IJ SOLN: 0.4000 mg | INTRAMUSCULAR | Status: DC | PRN

## 2024-10-24 MED ORDER — ONDANSETRON HCL 4 MG/2ML IJ SOLN: 4.0000 mg | Freq: Once | INTRAMUSCULAR | Status: DC | PRN

## 2024-10-24 MED ORDER — ENOXAPARIN SODIUM 60 MG/0.6ML IJ SOSY
50.0000 mg | PREFILLED_SYRINGE | INTRAMUSCULAR | Status: DC
  Administered 2024-10-25 – 2024-10-27 (×4): 50 mg via SUBCUTANEOUS
  Filled 2024-10-24 (×5): qty 0.6

## 2024-10-24 MED ORDER — SCOPOLAMINE 1 MG/3DAYS TD PT72
1.0000 | MEDICATED_PATCH | TRANSDERMAL | Status: DC
  Administered 2024-10-24: 1 mg via TRANSDERMAL

## 2024-10-24 MED ORDER — SODIUM CHLORIDE 0.9 % IV SOLN: 500.0000 mg | INTRAVENOUS | Status: DC

## 2024-10-24 MED ORDER — MORPHINE SULFATE (PF) 0.5 MG/ML IJ SOLN
INTRAMUSCULAR | Status: DC | PRN
  Administered 2024-10-24: 150 ug via INTRATHECAL

## 2024-10-24 MED ORDER — KETOROLAC TROMETHAMINE 30 MG/ML IJ SOLN
30.0000 mg | Freq: Four times a day (QID) | INTRAMUSCULAR | Status: DC | PRN
Start: 2024-10-24 — End: 2024-10-24

## 2024-10-24 MED ORDER — SCOPOLAMINE 1 MG/3DAYS TD PT72
MEDICATED_PATCH | TRANSDERMAL | Status: AC
  Filled 2024-10-24: qty 1

## 2024-10-24 MED ORDER — COCONUT OIL OIL: 1.0000 | TOPICAL_OIL | Status: DC | PRN

## 2024-10-24 MED ORDER — OXYTOCIN-SODIUM CHLORIDE 30-0.9 UT/500ML-% IV SOLN
INTRAVENOUS | Status: DC | PRN
  Administered 2024-10-24: 300 mL via INTRAVENOUS

## 2024-10-24 MED ORDER — FAMOTIDINE 20 MG PO TABS: 20.0000 mg | ORAL_TABLET | Freq: Two times a day (BID) | ORAL | Status: DC | PRN

## 2024-10-24 MED ORDER — MENTHOL 3 MG MT LOZG: 1.0000 | LOZENGE | OROMUCOSAL | Status: DC | PRN

## 2024-10-24 MED ORDER — FAMOTIDINE 20 MG PO TABS
20.0000 mg | ORAL_TABLET | Freq: Once | ORAL | Status: AC
  Administered 2024-10-24: 20 mg via ORAL

## 2024-10-24 MED ORDER — ZOLPIDEM TARTRATE 5 MG PO TABS: 5.0000 mg | ORAL_TABLET | Freq: Every evening | ORAL | Status: DC | PRN

## 2024-10-24 MED ORDER — MEPERIDINE HCL 25 MG/ML IJ SOLN: 6.2500 mg | INTRAMUSCULAR | Status: DC | PRN

## 2024-10-24 MED ORDER — FENTANYL CITRATE (PF) 100 MCG/2ML IJ SOLN
INTRAMUSCULAR | Status: DC | PRN
  Administered 2024-10-24: 15 ug via INTRATHECAL

## 2024-10-24 MED ORDER — PHENYLEPHRINE HCL (PRESSORS) 10 MG/ML IV SOLN
INTRAVENOUS | Status: DC | PRN
  Administered 2024-10-24: 160 ug via INTRAVENOUS

## 2024-10-24 MED ORDER — AMISULPRIDE (ANTIEMETIC) 5 MG/2ML IV SOLN: 10.0000 mg | Freq: Once | INTRAVENOUS | Status: DC | PRN

## 2024-10-24 MED ORDER — IBUPROFEN 600 MG PO TABS
600.0000 mg | ORAL_TABLET | Freq: Four times a day (QID) | ORAL | Status: DC
  Administered 2024-10-25 – 2024-10-28 (×11): 600 mg via ORAL
  Filled 2024-10-24 (×11): qty 1

## 2024-10-24 MED ORDER — FENTANYL CITRATE (PF) 100 MCG/2ML IJ SOLN
INTRAMUSCULAR | Status: AC
  Filled 2024-10-24: qty 2

## 2024-10-24 MED ORDER — NALOXONE HCL 4 MG/10ML IJ SOLN: 1.0000 ug/kg/h | INTRAVENOUS | Status: DC | PRN

## 2024-10-24 MED ORDER — DIPHENHYDRAMINE HCL 25 MG PO CAPS
25.0000 mg | ORAL_CAPSULE | ORAL | Status: DC | PRN
  Administered 2024-10-24: 25 mg via ORAL
  Filled 2024-10-24 (×2): qty 1

## 2024-10-24 MED ORDER — PHENYLEPHRINE HCL-NACL 20-0.9 MG/250ML-% IV SOLN
INTRAVENOUS | Status: DC | PRN
  Administered 2024-10-24: 60 ug/min via INTRAVENOUS

## 2024-10-24 MED ORDER — KETOROLAC TROMETHAMINE 30 MG/ML IJ SOLN
30.0000 mg | Freq: Four times a day (QID) | INTRAMUSCULAR | Status: AC
  Administered 2024-10-24 – 2024-10-25 (×4): 30 mg via INTRAVENOUS
  Filled 2024-10-24 (×4): qty 1

## 2024-10-24 MED ORDER — PRENATAL MULTIVITAMIN CH
1.0000 | ORAL_TABLET | Freq: Every day | ORAL | Status: DC
  Administered 2024-10-24 – 2024-10-28 (×5): 1 via ORAL
  Filled 2024-10-24 (×5): qty 1

## 2024-10-24 MED ORDER — SOD CITRATE-CITRIC ACID 500-334 MG/5ML PO SOLN
30.0000 mL | ORAL | Status: AC
  Administered 2024-10-24: 30 mL via ORAL

## 2024-10-24 MED ORDER — ONDANSETRON HCL 4 MG/2ML IJ SOLN
4.0000 mg | Freq: Three times a day (TID) | INTRAMUSCULAR | Status: DC | PRN
Start: 2024-10-24 — End: 2024-10-28

## 2024-10-24 MED ORDER — SCOPOLAMINE 1 MG/3DAYS TD PT72: 1.0000 | MEDICATED_PATCH | Freq: Once | TRANSDERMAL | Status: DC

## 2024-10-24 MED ORDER — MORPHINE SULFATE (PF) 0.5 MG/ML IJ SOLN
INTRAMUSCULAR | Status: AC
  Filled 2024-10-24: qty 10

## 2024-10-24 MED ORDER — ACETAMINOPHEN 10 MG/ML IV SOLN
INTRAVENOUS | Status: AC
  Filled 2024-10-24: qty 100

## 2024-10-24 MED ORDER — DIPHENHYDRAMINE HCL 25 MG PO CAPS: 25.0000 mg | ORAL_CAPSULE | Freq: Four times a day (QID) | ORAL | Status: DC | PRN

## 2024-10-24 MED ORDER — SIMETHICONE 80 MG PO CHEW
80.0000 mg | CHEWABLE_TABLET | Freq: Three times a day (TID) | ORAL | Status: DC
  Administered 2024-10-24 – 2024-10-28 (×12): 80 mg via ORAL
  Filled 2024-10-24 (×12): qty 1

## 2024-10-24 MED ORDER — STERILE WATER FOR IRRIGATION IR SOLN
Status: DC | PRN
  Administered 2024-10-24: 1

## 2024-10-24 MED ORDER — ACETAMINOPHEN 10 MG/ML IV SOLN
INTRAVENOUS | Status: DC | PRN
  Administered 2024-10-24 (×2): 1000 mg via INTRAVENOUS

## 2024-10-24 MED ORDER — KETOROLAC TROMETHAMINE 30 MG/ML IJ SOLN
INTRAMUSCULAR | Status: AC
  Filled 2024-10-24: qty 1

## 2024-10-24 MED ORDER — SOD CITRATE-CITRIC ACID 500-334 MG/5ML PO SOLN
ORAL | Status: AC
Start: 2024-10-24 — End: 2024-10-24
  Filled 2024-10-24: qty 30

## 2024-10-24 MED ORDER — DEXAMETHASONE SOD PHOSPHATE PF 10 MG/ML IJ SOLN
INTRAMUSCULAR | Status: DC | PRN
  Administered 2024-10-24: 10 mg via INTRAVENOUS

## 2024-10-24 MED ORDER — CEFAZOLIN SODIUM-DEXTROSE 2-4 GM/100ML-% IV SOLN
2.0000 g | INTRAVENOUS | Status: AC
  Administered 2024-10-24: 2 g via INTRAVENOUS

## 2024-10-24 MED ORDER — OXYCODONE HCL 5 MG PO TABS
5.0000 mg | ORAL_TABLET | ORAL | Status: DC | PRN
  Administered 2024-10-25: 5 mg via ORAL
  Administered 2024-10-25 – 2024-10-26 (×3): 10 mg via ORAL
  Administered 2024-10-26 – 2024-10-27 (×4): 5 mg via ORAL
  Filled 2024-10-24 (×2): qty 2
  Filled 2024-10-24 (×3): qty 1
  Filled 2024-10-24: qty 2
  Filled 2024-10-24 (×2): qty 1

## 2024-10-24 MED ORDER — OXYTOCIN-SODIUM CHLORIDE 30-0.9 UT/500ML-% IV SOLN
2.5000 [IU]/h | INTRAVENOUS | Status: AC
  Administered 2024-10-24: 2.5 [IU]/h via INTRAVENOUS
  Filled 2024-10-24: qty 500

## 2024-10-24 MED ORDER — GABAPENTIN 100 MG PO CAPS
100.0000 mg | ORAL_CAPSULE | Freq: Two times a day (BID) | ORAL | Status: DC
  Administered 2024-10-24 – 2024-10-28 (×9): 100 mg via ORAL
  Filled 2024-10-24 (×9): qty 1

## 2024-10-24 MED ORDER — SODIUM CHLORIDE 0.9% FLUSH: 3.0000 mL | INTRAVENOUS | Status: DC | PRN

## 2024-10-24 MED ORDER — FAMOTIDINE 20 MG PO TABS
ORAL_TABLET | ORAL | Status: AC
  Filled 2024-10-24: qty 1

## 2024-10-24 MED ORDER — ONDANSETRON HCL 4 MG/2ML IJ SOLN
INTRAMUSCULAR | Status: DC | PRN
  Administered 2024-10-24: 4 mg via INTRAVENOUS

## 2024-10-24 MED ORDER — WITCH HAZEL-GLYCERIN EX PADS: 1.0000 | MEDICATED_PAD | CUTANEOUS | Status: DC | PRN

## 2024-10-24 MED ORDER — SERTRALINE HCL 50 MG PO TABS
50.0000 mg | ORAL_TABLET | Freq: Every day | ORAL | Status: DC
  Administered 2024-10-24 – 2024-10-28 (×5): 50 mg via ORAL
  Filled 2024-10-24 (×5): qty 1

## 2024-10-24 MED ORDER — FAMOTIDINE IN NACL 20-0.9 MG/50ML-% IV SOLN: 20.0000 mg | Freq: Once | INTRAVENOUS | Status: DC | PRN

## 2024-10-24 MED ORDER — OXYTOCIN-SODIUM CHLORIDE 30-0.9 UT/500ML-% IV SOLN
INTRAVENOUS | Status: AC
  Filled 2024-10-24: qty 1000

## 2024-10-24 SURGICAL SUPPLY — 30 items
CHLORAPREP W/TINT 26ML (MISCELLANEOUS) ×2 IMPLANT
CLAMP UMBILICAL CORD (MISCELLANEOUS) ×1 IMPLANT
CLOTH BEACON ORANGE TIMEOUT ST (SAFETY) ×1 IMPLANT
DERMABOND ADVANCED .7 DNX12 (GAUZE/BANDAGES/DRESSINGS) IMPLANT
DISSECTOR SURG LIGASURE 21 (MISCELLANEOUS) IMPLANT
DRSG OPSITE POSTOP 4X10 (GAUZE/BANDAGES/DRESSINGS) ×1 IMPLANT
ELECTRODE REM PT RTRN 9FT ADLT (ELECTROSURGICAL) ×1 IMPLANT
EXTRACTOR VACUUM KIWI (MISCELLANEOUS) IMPLANT
GLOVE BIO SURGEON STRL SZ 6.5 (GLOVE) ×1 IMPLANT
GLOVE BIOGEL PI IND STRL 7.0 (GLOVE) ×2 IMPLANT
GOWN STRL REUS W/TWL LRG LVL3 (GOWN DISPOSABLE) ×2 IMPLANT
KIT ABG SYR 3ML LUER SLIP (SYRINGE) IMPLANT
MAT PREVALON FULL STRYKER (MISCELLANEOUS) IMPLANT
NDL HYPO 25X5/8 SAFETYGLIDE (NEEDLE) IMPLANT
NEEDLE HYPO 22GX1.5 SAFETY (NEEDLE) IMPLANT
NEEDLE HYPO 25X5/8 SAFETYGLIDE (NEEDLE) IMPLANT
NS IRRIG 1000ML POUR BTL (IV SOLUTION) ×1 IMPLANT
PACK C SECTION WH (CUSTOM PROCEDURE TRAY) ×1 IMPLANT
PAD OB MATERNITY 4.3X12.25 (PERSONAL CARE ITEMS) ×1 IMPLANT
RTRCTR C-SECT PINK 25CM LRG (MISCELLANEOUS) ×1 IMPLANT
SUT MNCRL 0 VIOLET CTX 36 (SUTURE) ×2 IMPLANT
SUT PDS AB 0 CTX 60 (SUTURE) ×1 IMPLANT
SUT VIC AB 0 CTX36XBRD ANBCTRL (SUTURE) ×1 IMPLANT
SUT VIC AB 2-0 CT1 TAPERPNT 27 (SUTURE) ×1 IMPLANT
SUT VIC AB 4-0 KS 27 (SUTURE) ×1 IMPLANT
SUTURE PLAIN GUT 2.0 ETHICON (SUTURE) ×1 IMPLANT
SYR 30ML LL (SYRINGE) IMPLANT
TOWEL OR 17X24 6PK STRL BLUE (TOWEL DISPOSABLE) ×1 IMPLANT
TRAY FOLEY W/BAG SLVR 14FR LF (SET/KITS/TRAYS/PACK) ×1 IMPLANT
WATER STERILE IRR 1000ML POUR (IV SOLUTION) ×1 IMPLANT

## 2024-10-24 NOTE — Lactation Note (Signed)
 This note was copied from a baby's chart. Lactation Consultation Note  Patient Name: Girl Janani Chamber Unijb'd Date: 10/24/2024 Age:40 hours Reason for consult: Initial assessment;Maternal endocrine disorder;Primapara;Term  P1. Mom had baby on the breast when LC came in rm. Mom was BF in laid back position. Baby appeared satisfied after feeding. Mom had GDM and baby has had low glucose levels. Mom had to supplement. Encouraged STS as much as possible as well. Newborn feeding habits, STS, I&O, positioning reviewed. Mom encouraged to feed baby 8-12 times/24 hours and with feeding cues.  Encouraged mom to call for assistance as needed. Maternal Data Does the patient have breastfeeding experience prior to this delivery?: No  Feeding    LATCH Score Latch: Grasps breast easily, tongue down, lips flanged, rhythmical sucking.  Audible Swallowing: A few with stimulation  Type of Nipple: Everted at rest and after stimulation  Comfort (Breast/Nipple): Soft / non-tender  Hold (Positioning): No assistance needed to correctly position infant at breast.  LATCH Score: 9   Lactation Tools Discussed/Used Tools: Pump Breast pump type: Double-Electric Breast Pump Pump Education:  (RN) Reason for Pumping: for supplementation  Interventions Interventions: Breast feeding basics reviewed;Skin to skin;Adjust position;Position options;Support pillows;Education;LC Services brochure  Discharge    Consult Status Consult Status: Follow-up Date: 10/25/24 Follow-up type: In-patient    Marisa Hage G 10/24/2024, 11:41 PM

## 2024-10-24 NOTE — Anesthesia Preprocedure Evaluation (Signed)
 Anesthesia Evaluation  Patient identified by MRN, date of birth, ID band Patient awake    Reviewed: Allergy & Precautions, NPO status , Patient's Chart, lab work & pertinent test results  Airway Mallampati: III  TM Distance: >3 FB Neck ROM: Full    Dental  (+) Teeth Intact, Dental Advisory Given   Pulmonary asthma    Pulmonary exam normal breath sounds clear to auscultation       Cardiovascular Exercise Tolerance: Good negative cardio ROS Normal cardiovascular exam Rhythm:Regular Rate:Normal     Neuro/Psych  Headaches PSYCHIATRIC DISORDERS Anxiety Depression Bipolar Disorder    Neuromuscular disease    GI/Hepatic negative GI ROS, Neg liver ROS,,,  Endo/Other  diabetes, Gestational, Insulin Dependent  Class 3 obesity  Renal/GU negative Renal ROS     Musculoskeletal  (+) Arthritis ,    Abdominal   Peds  (+) ADHD Hematology  (+) Blood dyscrasia, anemia Plt 248k   Anesthesia Other Findings Day of surgery medications reviewed with the patient.  Reproductive/Obstetrics                              Anesthesia Physical Anesthesia Plan  ASA: 3  Anesthesia Plan: Spinal   Post-op Pain Management:    Induction:   PONV Risk Score and Plan: 2 and Scopolamine  patch - Pre-op, Dexamethasone  and Ondansetron   Airway Management Planned: Natural Airway  Additional Equipment:   Intra-op Plan:   Post-operative Plan:   Informed Consent: I have reviewed the patients History and Physical, chart, labs and discussed the procedure including the risks, benefits and alternatives for the proposed anesthesia with the patient or authorized representative who has indicated his/her understanding and acceptance.     Dental advisory given  Plan Discussed with: CRNA and Anesthesiologist  Anesthesia Plan Comments: (Discussed risks and benefits of and differences between spinal and general. Discussed risks of  spinal including headache, backache, failure, bleeding, infection, and nerve damage. Patient consents to spinal. Questions answered. Coagulation studies and platelet count acceptable.)        Anesthesia Quick Evaluation

## 2024-10-24 NOTE — Op Note (Signed)
 CESAREAN SECTION Procedure Note  Patient: Brooke [name]  Preoperative Diagnosis: IUP at [redacted]w[redacted]d, desired primary C-section, desired permanent sterilization Postoperative Diagnosis: same, delivered  Procedure: primary low transverse C-section and bilateral salpingectomy     Surgeon: Rubie Husky, MD  Assistant surgeon: Jolene Gaskins, MD  A skilled assistant was required for this case due to its complexity.  Anesthesia: Spinal anesthesia   Findings: Normal appearing uterus, fallopian tubes bilaterally, and ovaries bilaterally.  Viable female infant in vertex presentation delivered at 08:00 with weight 7lb2oz; Apgars 7 and 8.  Estimated Blood Loss:  866cc         Specimens: 1) Placenta to L&D; 2) bilateral fallopian tubes to pathology         Complications:  None         Disposition: PACU - hemodynamically stable.         Condition: stable    Description of Procedure: The patient was taken to the operating room where spinal anesthesia was placed and found to be adequate. The patient was placed in the dorsal supine position.  Fetal heart tones were confirmed. SCD were applied and cycling. A foley catheter was inserted and draining. Ancef 2g were given for infection prophylaxis. The patient was subsequently prepped and draped in the normal sterile fashion.  A routine pre-operative time out was performed.  A low transverse skin incision was made with a scalpel and carried down to the level of the fascia with the Bovie.  The fascia was incised in the midline with the scalpel and extended laterally with curved Mayo scissors.  Kocher clamps were applied to the superior fascial edge and the fascia was dissected off the rectus muscle sharply using the Mayo scissors. The rectus muscles then were separated in the midline.  The peritoneum was found free of adherent bowel and the peritoneal cavity was entered bluntly.  The uterus was identified and the alexis retractor was placed  intraperitoneal.  A low transverse hysterotomy was then made with a scalpel. Rupture of membranes occurred with copious clear fluid. The infant was found in the vertex presentation was delivered atraumatically and without difficulty with standard maneuvers. No nuchal. Baby had good tone and cry and routine bulb suction of nares was performed. After 60 seconds of delayed cord clamping the cord was clamped and cut and the infant was handed off to the baby nurse. The placenta was delivered with gentle traction on umbilical cord and manual massage of the uterine fundus. The uterus was cleared of all clot and debris.  The hysterotomy was then closed with 0 monocryl in a running locked fashion. Continued oozing was noted, so an imbricating layer was placed over the hysterotomy using 0 monocryl.  Attention was turned to the left adnexa, where the left fallopian tube was followed out to the fimbriated end, elevated from the mesosalpinx with babcocks and separated from the mesosalpinx with the Ligasure by cauterization and then ligation. The Ligasure was used to cauterize and ligate the tube at the cornua and the freed tube was passed off to the scrub tech to go to pathology. This was repeated with the right fallopian tube.   Attention was returned to the hysterotomy were oozing was still noted at the left side of the hysterotomy. A figure of 8 suture was placed using 0 Monocryl at this site with good hemostasis. The entire hysterotomy was found to be hemostatic after light Bovie use on serosal edges. The Alexis retractor was removed. The peritoneum was closed in  a vertical continuous fashion using 2-0 Vicryl. The fascia was closed with a 0 PDS suture in a continuous running fashion.  The subcutaneous tissue was irrigated and rendered hemostatic with cautery.  The subcutaneous layer was subsequently closed with 2-0 plain gut in an interrupted fashion.  The skin was closed with 4-0 vicryl  in a running subcuticular  fashion.  Sponge, lap and needle counts were correct. Dermabond and a Honeycomb dressing were placed on the incision.  The patient and baby were both doing well when I left the OR.  Rubie Husky, MD 10/24/2024, 9:07am

## 2024-10-24 NOTE — H&P (Signed)
 Brooke [name] is a 38 y.o. female G3P0020 at [redacted]w[redacted]d presenting for elective scheduled primary C-section and bilateral salpingectomy for desired C-section and desired permanent sterilization. She is feeling well this morning. Baby is moving well. No complaints.  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 with dinner for a short time; stopped insulin due to episodic hypoglycemia -AMA: normal NIPT -mild intermittent asthma: on montelukast daily and albuterol  PRN -depression/anxiety: on zoloft 50mg   OB History     Gravida  3   Para      Term      Preterm      AB  2   Living         SAB  2   IAB      Ectopic      Multiple      Live Births             Past Medical History:  Diagnosis Date   ADHD    Anxiety    Asthma    Depression    GDM (gestational diabetes mellitus) 08/08/2024   Gestational diabetes    Migraines    Past Surgical History:  Procedure Laterality Date   AXILLARY LYMPH NODE BIOPSY Right    benign   DILATION AND EVACUATION N/A 08/10/2016   Procedure: DILATATION AND EVACUATION;  Surgeon: Gladis DELENA Dollar, MD;  Location: [facility] ORS;  Service: Gynecology;  Laterality: N/A;   KNEE SURGERY Right    repaired   TONSILLECTOMY     Family History: family history includes Cancer in her maternal grandfather and maternal grandmother; Diabetes in her maternal grandmother; Hypertension in her mother; Migraines in her mother; Osteoarthritis in her maternal grandmother; Throat cancer in her maternal grandmother. Social History:  reports that she has never smoked. She has never been exposed to tobacco smoke. She has never used smokeless tobacco. She reports that she does not currently use alcohol. She reports that she does not use drugs.     Maternal Diabetes: Yes:  Diabetes Type:  Insulin/Medication controlled Genetic Screening: Normal Maternal Ultrasounds/Referrals: Normal Fetal Ultrasounds or other Referrals:  None Maternal  Substance Abuse:  No Significant Maternal Medications:  Meds include: Other: montelukast, albuterol , zoloft Significant Maternal Lab Results:  Group B Strep negative Number of Prenatal Visits:greater than 3 verified prenatal visits Maternal Vaccinations:RSV: Given during pregnancy >/=14 days ago, TDap, Flu, and Covid   Review of Systems  All other systems reviewed and are negative.  Maternal Medical History:  Fetal activity: Perceived fetal activity is normal.   Last perceived fetal movement was within the past hour.   Prenatal complications: No bleeding, cholelithiasis, HIV, PIH, infection, IUGR, nephrolithiasis, oligohydramnios, placental abnormality, polyhydramnios, pre-eclampsia, preterm labor, substance abuse, thrombocytopenia or thrombophilia.   Prenatal Complications - Diabetes: gestational. Diabetes is managed by insulin injections and diet.       Blood pressure 129/86, temperature 98.1 F (36.7 C), temperature source Oral, resp. rate 18, height 5' 3 (1.6 m), weight 101.7 kg, last menstrual period 01/25/2024, SpO2 97%. Maternal Exam:  Abdomen: Patient reports no abdominal tenderness. Estimated fetal weight is 7.5lb.   Fetal presentation: vertex Introitus: not evaluated.   Cervix: not evaluated.   Fetal Exam Fetal Monitor Review: Mode: hand-held doppler probe.   Baseline rate: 135.    Physical Exam Vitals reviewed.  Constitutional:      Appearance: Normal appearance.  HENT:     Head: Normocephalic.     Nose: Nose normal.  Eyes:  Extraocular Movements: Extraocular movements intact.  Cardiovascular:     Comments: Well perfused Pulmonary:     Effort: Pulmonary effort is normal.  Abdominal:     Comments: Gravid, non-tender  Musculoskeletal:        General: Normal range of motion.     Cervical back: Normal range of motion.  Skin:    General: Skin is warm and dry.  Neurological:     General: No focal deficit present.     Mental Status: She is alert and  oriented to person, place, and time.  Psychiatric:        Mood and Affect: Mood normal.        Behavior: Behavior normal.        Thought Content: Thought content normal.        Judgment: Judgment normal.     Prenatal labs: ABO, Rh: --/--/A POS (11/12 0915) Antibody: NEG (11/12 0915) Rubella: Immune (08/29 0000) RPR: NON REACTIVE (11/12 0918)  HBsAg: Negative (08/29 0000)  HIV: Non-reactive (08/29 0000)  GBS: Negative/-- (10/24 0000)   Assessment/Plan: Brooke [name] is a 38 y.o. female G3P0020 at [redacted]w[redacted]d presenting for elective scheduled primary C-section and bilateral salpingectomy for desired C-section and desired permanent sterilization. -C-sec+BS: previously consented as below. Ancef for infection ppx. SCDs for VTE ppx.  We thoroughly discussed the risks and benefits of of a C-section and bilateral salpingectomy. Risks include bleeding (she would accept a blood transfusion in an emergency), damage to surrounding structures, and infection. We discussed that any of these complications could lead to the need for longer hospitalization, need for additional procedures/surgeries, or need for more medications. In terms of bilateral salpingectomy, she is aware of other forms of birth control and the risk of regret. All questions were answered and patient voiced understanding of risks. She would like to proceed with the surgery.   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 Has not take insulin in over two weeks. CBG 91 this AM.  -AMA: normal NIPT -mild intermittent asthma: on montelukast daily (ran out and hasn't taken in two days) and albuterol  PRN -depression/anxiety: on zoloft 50mg   Dispo: To OR. Welcome baby girl Brooke [name]! Partner Brooke [name] is support person.   Brooke [name] A Brooke [name] 10/24/2024, 6:54 AM

## 2024-10-24 NOTE — Transfer of Care (Signed)
 Immediate Anesthesia Transfer of Care Note  Patient: Brooke [name] [facility]  Procedure(s) Performed: CESAREAN SECTION, WITH BILATERAL TUBAL LIGATION (Bilateral)  Patient Location: PACU  Anesthesia Type:Spinal  Level of Consciousness: awake, alert , and oriented  Airway & Oxygen Therapy: Patient Spontanous Breathing  Post-op Assessment: Report given to RN and Post -op Vital signs reviewed and stable  Post vital signs: Reviewed and stable  Last Vitals:  Vitals Value Taken Time  BP 121/84 10/24/24 09:03  Temp    Pulse 81 10/24/24 09:06  Resp 24 10/24/24 09:06  SpO2 92 % 10/24/24 09:06  Vitals shown include unfiled device data.  Last Pain:  Vitals:   10/24/24 0551  TempSrc:   PainSc: 0-No pain         Complications: No notable events documented.

## 2024-10-25 LAB — CBC
HCT: 21.5 % — ABNORMAL LOW (ref 36.0–46.0)
Hemoglobin: 7.1 g/dL — ABNORMAL LOW (ref 12.0–15.0)
MCH: 28.6 pg (ref 26.0–34.0)
MCHC: 33 g/dL (ref 30.0–36.0)
MCV: 86.7 fL (ref 80.0–100.0)
Platelets: 213 K/uL (ref 150–400)
RBC: 2.48 MIL/uL — ABNORMAL LOW (ref 3.87–5.11)
RDW: 15.9 % — ABNORMAL HIGH (ref 11.5–15.5)
WBC: 11.1 K/uL — ABNORMAL HIGH (ref 4.0–10.5)
nRBC: 0 % (ref 0.0–0.2)

## 2024-10-25 LAB — CREATININE, SERUM
Creatinine, Ser: 0.6 mg/dL (ref 0.44–1.00)
GFR, Estimated: >60 mL/min (ref >60)

## 2024-10-25 LAB — GLUCOSE, CAPILLARY: Glucose-Capillary: 100 mg/dL — ABNORMAL HIGH (ref 70–99)

## 2024-10-25 MED ORDER — ACETAMINOPHEN 500 MG PO TABS
1000.0000 mg | ORAL_TABLET | Freq: Four times a day (QID) | ORAL | Status: DC
  Administered 2024-10-25 – 2024-10-28 (×13): 1000 mg via ORAL
  Filled 2024-10-25 (×14): qty 2

## 2024-10-25 MED ORDER — FERROUS SULFATE 325 (65 FE) MG PO TABS
325.0000 mg | ORAL_TABLET | Freq: Every day | ORAL | Status: DC
  Administered 2024-10-26 – 2024-10-28 (×3): 325 mg via ORAL
  Filled 2024-10-25 (×3): qty 1

## 2024-10-25 NOTE — Lactation Note (Signed)
 This note was copied from a baby's chart. Lactation Consultation Note  Patient Name: Brooke [name] Date: 10/25/2024 Age:40 hours Reason for consult: Follow-up assessment;1st time breastfeeding;Term;Infant weight loss;Maternal endocrine disorder (See MOB: mR-AMA, A2GDm and C/S delivery, infant with weight loss of -2.17 .)  C/O: Per MOB, infant latched well last night  however, during the day she off and on the breast, infant is breastfeeding for 10 minutes and MOB has started supplementing infant with formula her choice and secondly due infant previously having hx of low blood sugars see initial  results review.   P1, MOB has been set up with DEBP has pumped once and expressed 2 mls. MOB goal is to breastfeed. LC unable assist with latch currently due infant receiving 15 mls of 20 kcal formula. LC did re-teaching regarding formula usage, infant given formula twice two different feeding. LC reviewed RTF formula once open is only safe for 1 hour and MOB taught back and agree verbally. MOB will continue to latch infant first every feeding, afterwards will supplement infant with any EBM first and the formula feeding infant by cues, on demand, 8-12 times within 24 hours, skin to skin. MOB was using the DEBP and expressing colostrum in breast flanges as LC left the room. MOB knows that her EBM is safe for 4 hours at room temperature.   Day 2 feeding plans:  1- MOB will continue to latch infant first every feeding by cues, on demand, 8-12 times within 24 hours, skin to skin. 2- MOB will supplement infant with any pumped EBM first and then 20 kcal formula. MOB knows after latching infant at the breast, Day 2 supplement amount is (7-12 mls) or more if infant wants it. 3- MOB will continue to use the DEBP every 3 hours for 15 minutes on initial setting.  4- MOB will discard RTF formula after opening it within 1 hour.  Maternal Data    Feeding Mother's Current Feeding Choice: Breast Milk and  Formula  LATCH Score                   Lactation Tools Discussed/Used    Interventions Interventions: Skin to skin;Hand express;DEBP;Education;CDC milk storage guidelines;CDC Guidelines for Breast Pump Cleaning;Guidelines for Milk Supply and Pumping Schedule Handout  Discharge Pump: DEBP;Personal  Consult Status Consult Status: Follow-up Date: 10/26/24 Follow-up type: In-patient    Brooke [name] 10/25/2024, 11:30 AM

## 2024-10-25 NOTE — Progress Notes (Signed)
 Subjective: Postpartum Day 1: Elective Primary Cesarean Delivery w/ BS Patient is doing well. Rate pain 3/10. Noted dizziness with ambulation yesterday, but has improved today. Foley removed and is voiding without difficulty. Tolerating regular diet w/o N/V. Passing flatus. Having some difficulty with latching so breast and bottle feeding.   Objective: Vital signs in last 24 hours: Temp:  [97.5 F (36.4 C)-98.3 F (36.8 C)] 98.3 F (36.8 C) (11/15 0509) Pulse Rate:  [76-93] 91 (11/15 0509) Resp:  [12-21] 20 (11/15 0509) BP: (95-135)/(63-95) 116/63 (11/15 0509) SpO2:  [97 %-100 %] 98 % (11/15 0509)  Physical Exam:  General: alert and no distress Lochia: appropriate Uterine Fundus: firm Incision: honeycomb dressing in place, no strike-through DVT Evaluation: No evidence of DVT seen on physical exam.  Recent Labs    10/22/24 0918 10/25/24 0442  HGB 10.2* 7.1*  HCT 31.2* 21.5*    Assessment/Plan: Status post Cesarean section. Doing well postoperatively.  Continue current care - anticipate discharge on POD2-3.  GDMA2: was on Novolog 12u with dinner for a short time; stopped insulin due to episodic hypoglycemia. Am fasting FSBS 100. Plan for 2hr gtt post-partum  mild intermittent asthma: on montelukast daily and albuterol  PRN depression/anxiety: on zoloft 50mg  Acute blood loss anemia, significant for this admission: Admit Hgb 10.2, EBL 856. Hgb 7.1 on POD1. Restart PO iron. Consider IV iron if becomes symptomatic   Baby girl - Hilma Charmaine CHRISTELLA Lane, MD 10/25/2024, 9:05 AM

## 2024-10-25 NOTE — Progress Notes (Signed)
 MOB was referred for history of depression/anxiety. * Referral screened out by Clinical Social Worker because none of the following criteria appear to apply: ~ History of anxiety/depression during this pregnancy, or of post-partum depression following prior delivery. ~ Diagnosis of anxiety and/or depression within last 3 years OR * MOB's symptoms currently being treated with medication and/or therapy. MOB is treating symptoms of anxiety and depression with hydroxyzine  25mg  PRN and Zoloft 50mg  daily.  Please contact the Clinical Social Worker if needs arise, by [facility] request, or if MOB scores greater than 9/yes to question 10 on Edinburgh Postpartum Depression Screen.  Signed,  Sharyne LOIS Roulette, MSW, LCSWA, LCASA 10/25/2024 10:25 AM

## 2024-10-26 NOTE — Progress Notes (Signed)
 Pt's BP 128/88 @ 2038, 138/84 @ 2126. Asymptomatic, c/o pain 7/10, just medicated with 10 mg of oxycodone  at 2158. Dr. Lane called and informed. RN to call for BP >140/90.

## 2024-10-26 NOTE — Progress Notes (Signed)
 Postpartum Progress Note   Postpartum Day 2: Elective Primary Cesarean Delivery w/ BS Subjective:  Patient notes increased pain this morning. Notes got up to use the restroom and tidy the room, and had increased pain after this. Ambulating and voiding without difficulty. Tolerating regular diet w/o N/V. Passing flatus and had a bowel movement. Breast and bottle feeding.   Patient became tearful in the room, notes is just a little overwhelmed.   Objective: Vital signs in last 24 hours: Temp:  [97.9 F (36.6 C)-98.2 F (36.8 C)] 98.2 F (36.8 C) (11/16 0641) Pulse Rate:  [82-92] 92 (11/16 0641) Resp:  [18-20] 20 (11/16 0641) BP: (102-136)/(64-82) 136/82 (11/16 0641) SpO2:  [99 %-100 %] 99 % (11/16 0641)  Physical Exam:  General: alert and no distress Respiratory: no increased work of breathing  Lochia: appropriate Uterine Fundus: firm Incision: honeycomb dressing in place, no strike-through DVT Evaluation: No evidence of DVT seen on physical exam.  Recent Labs    10/25/24 0442  HGB 7.1*  HCT 21.5*    Assessment/Plan: Status post Cesarean section. Doing well postoperatively. Increased pain today Continue current inpatient care.  GDMA2: was on Novolog 12u with dinner for a short time; stopped insulin due to episodic hypoglycemia.  POD1 AM fasting FSBS 100. Plan for 2hr gtt post-partum  Mild intermittent asthma: on montelukast daily and albuterol  PRN Depression/anxiety: on zoloft 50mg  1-2 week mood check on discharge  Acute blood loss anemia, significant for this admission: Admit Hgb 10.2, EBL 856. Hgb 7.1 on POD1 PO iron ordered   Dispo: discharge on POD3.  Charmaine CHRISTELLA Oz, MD 10/26/2024, 7:17 AM

## 2024-10-26 NOTE — Lactation Note (Addendum)
 This note was copied from a baby's chart. Lactation Consultation Note  Patient Name: Girl Mylena Sedberry Unijb'd Date: 10/26/2024 Age:40 hours Reason for consult: Follow-up assessment;1st time breastfeeding;TermSee MOB: MR-AMA, A2GDm and C/S delivery, infant maintain current weight loss -2.17% in past 24 hour, no change in weight).  P1, Per MOB infant is latching well at the breast and been cluster feeding today, most feedings are 25 to 30 minutes or longer. MOB only using the DEBP at her discretion due to infant latching well at the breast. MOB will continue to breastfeed infant by cues, on demand, 8-12 times within 24 hours, skin to skin. MOB does not have any questions or concerns for LC at this time. Infant having adequate voids and stools. See flow sheet for infant feeding.    Maternal Data    Feeding Mother's Current Feeding Choice: Breast Milk and Formula  LATCH Score                    Lactation Tools Discussed/Used    Interventions Interventions: Position options;Education  Discharge    Consult Status Consult Status: Follow-up Date: 10/27/24 Follow-up type: In-patient    Grayce LULLA Batter 10/26/2024, 7:44 PM

## 2024-10-27 ENCOUNTER — Encounter (HOSPITAL_COMMUNITY): Payer: Self-pay | Admitting: Obstetrics and Gynecology

## 2024-10-27 LAB — COMPREHENSIVE METABOLIC PANEL WITH GFR
ALT: 23 U/L (ref 0–44)
AST: 27 U/L (ref 15–41)
Albumin: 2.2 g/dL — ABNORMAL LOW (ref 3.5–5.0)
Alkaline Phosphatase: 83 U/L (ref 38–126)
Anion gap: 11 (ref 5–15)
BUN: 12 mg/dL (ref 6–20)
CO2: 21 mmol/L — ABNORMAL LOW (ref 22–32)
Calcium: 8.8 mg/dL — ABNORMAL LOW (ref 8.9–10.3)
Chloride: 105 mmol/L (ref 98–111)
Creatinine, Ser: 0.71 mg/dL (ref 0.44–1.00)
GFR, Estimated: >60 mL/min (ref >60)
Glucose, Bld: 107 mg/dL — ABNORMAL HIGH (ref 70–99)
Potassium: 3.9 mmol/L (ref 3.5–5.1)
Sodium: 137 mmol/L (ref 135–145)
Total Bilirubin: 0.5 mg/dL (ref 0.0–1.2)
Total Protein: 5.3 g/dL — ABNORMAL LOW (ref 6.5–8.1)

## 2024-10-27 LAB — CBC
HCT: 23.9 % — ABNORMAL LOW (ref 36.0–46.0)
Hemoglobin: 7.9 g/dL — ABNORMAL LOW (ref 12.0–15.0)
MCH: 28.6 pg (ref 26.0–34.0)
MCHC: 33.1 g/dL (ref 30.0–36.0)
MCV: 86.6 fL (ref 80.0–100.0)
Platelets: 314 K/uL (ref 150–400)
RBC: 2.76 MIL/uL — ABNORMAL LOW (ref 3.87–5.11)
RDW: 15.8 % — ABNORMAL HIGH (ref 11.5–15.5)
WBC: 7.9 K/uL (ref 4.0–10.5)
nRBC: 0 % (ref 0.0–0.2)

## 2024-10-27 MED ORDER — CYCLOBENZAPRINE HCL 10 MG PO TABS
5.0000 mg | ORAL_TABLET | Freq: Once | ORAL | Status: AC
  Administered 2024-10-27: 5 mg via ORAL
  Filled 2024-10-27: qty 1

## 2024-10-27 MED ORDER — SODIUM CHLORIDE 0.9 % IV BOLUS: 500.0000 mL | Freq: Once | INTRAVENOUS | Status: DC | PRN

## 2024-10-27 MED ORDER — SODIUM CHLORIDE 0.9 % IV SOLN
500.0000 mg | Freq: Once | INTRAVENOUS | Status: AC
  Administered 2024-10-27: 500 mg via INTRAVENOUS
  Filled 2024-10-27: qty 25

## 2024-10-27 MED ORDER — ALBUTEROL SULFATE (2.5 MG/3ML) 0.083% IN NEBU: 2.5000 mg | INHALATION_SOLUTION | Freq: Once | RESPIRATORY_TRACT | Status: DC | PRN

## 2024-10-27 MED ORDER — METHYLPREDNISOLONE SODIUM SUCC 125 MG IJ SOLR: 125.0000 mg | Freq: Once | INTRAMUSCULAR | Status: DC | PRN

## 2024-10-27 MED ORDER — SODIUM CHLORIDE 0.9 % IV SOLN: INTRAVENOUS | Status: AC | PRN

## 2024-10-27 MED ORDER — DIPHENHYDRAMINE HCL 50 MG/ML IJ SOLN
25.0000 mg | Freq: Once | INTRAMUSCULAR | Status: DC | PRN
Start: 2024-10-27 — End: 2024-10-28

## 2024-10-27 MED ORDER — EPINEPHRINE 0.3 MG/0.3ML IJ SOAJ: 0.3000 mg | Freq: Once | INTRAMUSCULAR | Status: DC | PRN

## 2024-10-27 NOTE — Lactation Note (Signed)
 This note was copied from a baby's chart. Lactation Consultation Note  Patient Name: Brooke [name] Date: 10/27/2024 Age:40 hours Reason for consult: Follow-up assessment;1st time breastfeeding  P1, Baby was latched toward the end of a feeding when [facility] entered the room.  Baby unlatched after 40 min feeding.  Nipple rounded.  No trauma to nipple noted.   Mother is breastfeeding and supplementing with formula. Encouraged her to continue to offer the breast first to help stimulate her supply.  Recommend pumping if she continues to offer formula to help establish her supply. Mother states her pump will arrive at her home tomorrow and can use manual pump until then.  Reviewed engorgement care and monitoring voids/stools.   Maternal Data Does the patient have breastfeeding experience prior to this delivery?: No  Feeding Mother's Current Feeding Choice: Breast Milk and Formula Nipple Type: Slow - flow  LATCH Score Latch: Grasps breast easily, tongue down, lips flanged, rhythmical sucking.  Audible Swallowing: Spontaneous and intermittent  Type of Nipple: Everted at rest and after stimulation  Comfort (Breast/Nipple): Soft / non-tender  Hold (Positioning): No assistance needed to correctly position infant at breast.  LATCH Score: 10   Lactation Tools Discussed/Used Tools: Pump Breast pump type: Double-Electric Breast Pump;Manual Pumping frequency: q 3 hours for 15 min if not latching  Interventions Interventions: Breast feeding basics reviewed;Education  Discharge Discharge Education: Engorgement and breast care;Warning signs for feeding baby  Consult Status Consult Status: Complete Date: 10/27/24   Shannon Dines Boschen  RN, IBCLC 10/27/2024, 9:12 AM

## 2024-10-27 NOTE — Progress Notes (Addendum)
 Brief Update Note  IV iron infusion for symptomatic acute blood loss anemia now complete, but patient has had three mild range blood pressures in the last four hours, ruling her in for gestational hypertension. 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 On review of MAU visits, she had a mild range blood pressure on 11/1 and 11/10 as well. All Bps in clinic during her prenatal care were wnl. Patient is also reporting to nursing mild blurred vision that improved with eating and a mild headache.      10/27/2024    7:22 PM 10/27/2024    5:53 PM 10/27/2024    4:22 PM  Vitals with BMI  Systolic 144 142 866  Diastolic 77 82 78  Pulse 97 85 94   A/P: Due to newly diagnosed gestational hypertension with mild range blood pressures and symptoms, will continue inpatient monitoring of blood pressures, symptomatic treatment, and will collect pre-eclampsia labs. Plan communicated to bedside RN.  Addendum:  PIH labs wnl.

## 2024-10-27 NOTE — Progress Notes (Signed)
CSW acknowledged consult and completed a clinical assessment.  There are no barriers to d/c.  Clinical assessment notes will be entered at a later time.   Enos Fling, Theresia Majors Clinical Social Worker [phone]

## 2024-10-27 NOTE — Progress Notes (Signed)
 CSW received consult due to score 13 on Edinburgh Depression Screen.CSW met MOB at bedside to complete a mental health assessment and offer support. CSW entered the room, introduced herself and acknowledged that FOB was sleeping on the couch. MOB gave CSW verbal permission to speak about anything while he was present. CSW explained her role and the reason for the visit. MOB presented as calm, was agreeable to consult and remained engaged throughout encounter.  Patient declines a referral to [employer]. Patient verbalizes understanding that the appointment will be virtual. CSW acknowledged Edinburgh score of 13 and listened to [facility] explore her feelings about transitioning into motherhood. MOB reported her baseline as always worried and has hx of anxiety. MOB reported currently worried about PPD symptoms and some episodes of sadness. CSW validated MOB worries and reported her current feelings are normal. MOB reported she is very excited for the arrival of the infant and her delivery was a good process. CSW inquired about MOB's mental health history.  MOB reported being diagnosed with anxiety/depression in the early 20's. MOB reported currently prescribed Zoloft and will continue this regiment during the PP period for support. CSW asked MOB would therapy resources be supportive during this PP period; MOB said yes. CSW provided MOB with therapy resources for [place]. MOB reported her support as FOB and her family. CSW provided education regarding Baby Blues vs PMADs and provided MOB with resources for mental health follow up.  CSW encouraged MOB to evaluate her mental health throughout the postpartum period with the use of the New Mom Checklist developed by Postpartum Progress as well as the Edinburgh Postnatal Depression Scale and notify a medical professional if symptoms arise. CSW assessed for safety with MOB SI and HI; MOB denied all. CSW did not assess for DV; FOB was present.  CSW  asked MOB has she selected a pediatrician for the infant's follow up visits; MOB said [facility].  MOB reported having all essential items for the infant including a carseat, bassinet and crib for safe sleeping. CSW provided review of Sudden Infant Death Syndrome (SIDS) precautions.   CSW identifies no further need for intervention and no barriers to discharge at this time.  Rosina Molt, [place] Clinical Social Worker [phone]

## 2024-10-27 NOTE — Progress Notes (Signed)
 Pt called out reporting dizziness and blurry vision. She also reports that she has hardly eaten all day because she has not felt hungry. Educated pt on the importance of eating regular meals, especially when she is breastfeeding. Pt agreed and is going to order dinner now.

## 2024-10-27 NOTE — Progress Notes (Signed)
 Postpartum Progress Note   Postpartum Day 3: Elective Primary Cesarean Delivery w/ BS Subjective:  Patient notes improved pain this morning. Ambulating and voiding without difficulty. Mildl lightheadedness. Tolerating regular diet w/o N/V. Passing flatus and had a bowel movement. Breast and bottle feeding. Lochia appropriate. Mood has been up and down, but no more crying episodes since yesterday. Desires to increase zoloft preventatively.   Objective: Vital signs in last 24 hours: Temp:  [98.1 F (36.7 C)-98.5 F (36.9 C)] 98.1 F (36.7 C) (11/17 0550) Pulse Rate:  [70-104] 94 (11/17 0550) Resp:  [18-20] 19 (11/17 0550) BP: (128-138)/(76-88) 130/83 (11/17 0550) SpO2:  [97 %-99 %] 97 % (11/17 0550)  Physical Exam:  General: alert and no distress; pale and tired appearing. Respiratory: no increased work of breathing  Lochia: appropriate Uterine Fundus: firm Incision: honeycomb dressing in place, no strike-through DVT Evaluation: No evidence of DVT seen on physical exam.  Recent Labs    10/25/24 0442  HGB 7.1*  HCT 21.5*    Assessment/Plan: Brooke [name] is a 38 y.o. female G3 now P8 s/p elective scheduled primary C-section and bilateral salpingectomy at [redacted]w[redacted]d for desired C-section and desired permanent sterilization.  POD 3: Status post Cesarean section. Doing well postoperatively. Continue current inpatient care. 2. GDMA2: was on Novolog 12u with dinner for a short time; stopped insulin due to episodic hypoglycemia. POD1 AM fasting FSBS 100. Plan for 2hr gtt post-partum  3. Mild intermittent asthma: on montelukast daily and albuterol  PRN 4. Depression/anxiety: on zoloft 50mg , will increase to 75mg  daily. 1-2 week mood check on discharge  5. Acute blood loss anemia, significant for this admission: Admit Hgb 10.2, EBL 856. Hgb 7.1 on POD1. Mildly symptomatic, will give IV iron infusion prior to discharge.   Dispo: Anticipate discharge today after iron  infusion.  Brooke DELENA Husky, MD 10/27/2024, 10:29 AM

## 2024-10-27 NOTE — Anesthesia Postprocedure Evaluation (Signed)
 Anesthesia Post Note  Patient: Brooke [name] [facility]  Procedure(s) Performed: CESAREAN SECTION, WITH BILATERAL TUBAL LIGATION (Bilateral)     Patient location during evaluation: PACU Anesthesia Type: Spinal Level of consciousness: awake, awake and alert and oriented Pain management: pain level controlled Vital Signs Assessment: post-procedure vital signs reviewed and stable Respiratory status: spontaneous breathing, nonlabored ventilation and respiratory function stable Cardiovascular status: blood pressure returned to baseline and stable Postop Assessment: no headache, no backache, spinal receding and no apparent nausea or vomiting Anesthetic complications: no   No notable events documented.  Last Vitals:    Last Pain:                 Garnette FORBES Skillern

## 2024-10-28 LAB — SURGICAL PATHOLOGY

## 2024-10-28 MED ORDER — OXYCODONE HCL 5 MG PO TABS: 5.0000 mg | ORAL_TABLET | Freq: Four times a day (QID) | ORAL | 0 refills | Status: AC | PRN

## 2024-10-28 MED ORDER — IBUPROFEN 600 MG PO TABS: 600.0000 mg | ORAL_TABLET | Freq: Four times a day (QID) | ORAL | 0 refills | Status: AC

## 2024-10-28 MED ORDER — ACETAMINOPHEN 500 MG PO TABS: 1000.0000 mg | ORAL_TABLET | Freq: Four times a day (QID) | ORAL | 0 refills | Status: AC

## 2024-10-28 NOTE — Discharge Summary (Signed)
 Postpartum Discharge Summary  Date of Service updated 10/28/24     Patient Name: Brooke [name] [facility] DOB: May 21, 1986 MRN: [id number]  Date of admission: 10/24/2024 Delivery date:10/24/2024 Delivering provider: CLAIRE RAMAN A Date of discharge: 10/28/2024  Admitting diagnosis: Insulin controlled gestational diabetes mellitus (GDM) in third trimester [O24.414] Advanced maternal age in multigravida, third trimester [O09.523] Post-operative state [Z98.890] Intrauterine pregnancy: [redacted]w[redacted]d     Secondary diagnosis:  Principal Problem:   Post-operative state  Additional problems: gestational hypertension    Discharge diagnosis: Term Pregnancy Delivered and Gestational Hypertension                                              Post partum procedures:n/a Augmentation: N/A Complications: None  Hospital course: Sceduled C/S   38 y.o. yo [id number] at [redacted]w[redacted]d was admitted to the hospital 10/24/2024 for scheduled cesarean section with the following indication:Elective Primary.Delivery details are as follows:  Membrane Rupture Time/Date: 7:59 AM,10/24/2024  Delivery Method:C-Section, Low Transverse Operative Delivery:N/A Details of operation can be found in separate operative note.  Patient had a postpartum course complicated by development of gestational HTN on POD#3.  She was monitored an additional evening.  She did not require initiation of BP meds.  She is ambulating, tolerating a regular diet, passing flatus, and urinating well. Patient is discharged home in stable condition on  10/28/24        Newborn Data: Birth date:10/24/2024 Birth time:8:00 AM Gender:Female Living status:Living Apgars:7 ,8  Weight:3230 g    Magnesium Sulfate received: No BMZ received: No Rhophylac:N/A MMR:N/A T-DaP:Given prenatally Flu: Yes RSV Vaccine received: Yes Transfusion:No Immunizations administered:  There is no immunization history on file for this patient.  Physical exam  Vitals:    10/27/24 1753 10/27/24 1922 10/27/24 2153 10/28/24 0516  BP: (!) 142/82 (!) 144/77 136/88 136/87  Pulse: 85 97 93 (!) 101  Resp: 17 18 19 18   Temp: 98 F (36.7 C) 98 F (36.7 C) 98 F (36.7 C) 98 F (36.7 C)  TempSrc: Oral Oral Oral Oral  SpO2: 100% 99% 95% 99%  Weight:      Height:       General: alert, cooperative, and no distress Lochia: appropriate Uterine Fundus: firm Incision: Healing well with no significant drainage DVT Evaluation: No evidence of DVT seen on physical exam. Labs: Lab Results  Component Value Date   WBC 7.9 10/27/2024   HGB 7.9 (L) 10/27/2024   HCT 23.9 (L) 10/27/2024   MCV 86.6 10/27/2024   PLT 314 10/27/2024      Latest Ref Rng & Units 10/27/2024    7:29 PM  CMP  Glucose 70 - 99 mg/dL 892   BUN 6 - 20 mg/dL 12   Creatinine 9.55 - 1.00 mg/dL 9.28   Sodium 864 - 854 mmol/L 137   Potassium 3.5 - 5.1 mmol/L 3.9   Chloride 98 - 111 mmol/L 105   CO2 22 - 32 mmol/L 21   Calcium 8.9 - 10.3 mg/dL 8.8   Total Protein 6.5 - 8.1 g/dL 5.3   Total Bilirubin 0.0 - 1.2 mg/dL 0.5   Alkaline Phos 38 - 126 U/L 83   AST 15 - 41 U/L 27   ALT 0 - 44 U/L 23    Edinburgh Score:    10/27/2024    7:25 AM  Edinburgh Postnatal Depression Scale Screening  Tool  I have been able to laugh and see the funny side of things. 1  I have looked forward with enjoyment to things. 1  I have blamed myself unnecessarily when things went wrong. 3  I have been anxious or worried for no good reason. 2  I have felt scared or panicky for no good reason. 2  Things have been getting on top of me. 2  I have been so unhappy that I have had difficulty sleeping. 1  I have felt sad or miserable. 0  I have been so unhappy that I have been crying. 1  The thought of harming myself has occurred to me. 0  Edinburgh Postnatal Depression Scale Total 13      After visit meds:  Allergies as of 10/28/2024       Reactions   Hydrocodone Palpitations, Other (See Comments)   Elevated heart  rate  Elevated heart rate Elevated heart rate, Elevated heart rate        Medication List     STOP taking these medications    Alpha-Lipoic Acid 600 MG Caps   aspirin 81 MG chewable tablet   fluticasone 50 MCG/ACT nasal spray Commonly known as: FLONASE   Magnesium Glycinate 100 MG Caps   magnesium oxide 400 MG tablet Commonly known as: MAG-OX   NovoLOG FlexPen 100 UNIT/ML FlexPen Generic drug: insulin aspart   omeprazole 20 MG capsule Commonly known as: PRILOSEC   ondansetron  4 MG tablet Commonly known as: Zofran    Prenatal 28-0.8 MG Tabs   pyridoxine 100 MG tablet Commonly known as: B-6   saccharomyces boulardii 250 MG capsule Commonly known as: FLORASTOR   sertraline 25 MG tablet Commonly known as: ZOLOFT   Vitamin D3 25 MCG (1000 UT) Caps       TAKE these medications    acetaminophen  500 MG tablet Commonly known as: TYLENOL  Take 2 tablets (1,000 mg total) by mouth every 6 (six) hours. What changed: See the new instructions.   albuterol  108 (90 Base) MCG/ACT inhaler Commonly known as: VENTOLIN  HFA Inhale 1-2 puffs into the lungs every 6 (six) hours as needed for wheezing or shortness of breath.   famotidine  20 MG tablet Commonly known as: Pepcid  Take 1 tablet (20 mg total) by mouth 2 (two) times daily as needed for heartburn or indigestion.   ferrous sulfate 325 (65 FE) MG tablet Take 1 tablet (325 mg total) by mouth daily.   hydrOXYzine  25 MG capsule Commonly known as: VISTARIL  Take 1 capsule (25 mg total) by mouth every 8 (eight) hours as needed.   ibuprofen  600 MG tablet Commonly known as: ADVIL  Take 1 tablet (600 mg total) by mouth every 6 (six) hours.   oxyCODONE  5 MG immediate release tablet Commonly known as: Oxy IR/ROXICODONE  Take 1 tablet (5 mg total) by mouth every 6 (six) hours as needed for severe pain (pain score 7-10).               Discharge Care Instructions  (From admission, onward)           Start      Ordered   10/28/24 0000  No dressing needed        10/28/24 0855             Discharge home in stable condition Infant Feeding: Breast Infant Disposition:home with mother Discharge instruction: per After Visit Summary and Postpartum booklet. Activity: Advance as tolerated. Pelvic rest for 6 weeks.  Diet: routine diet Anticipated Birth  Control: bilateral salpingectomy Postpartum Appointment:4 weeks Additional Postpartum F/U: BP check 3-5 days Future Appointments:No future appointments. Follow up Visit:  Follow-up Information     Ob/Gyn, Brooke [name] Follow up in 3 day(s).   Why: BP check in 3-5 days Contact information: [address] [place] [address] [phone]                     10/28/2024 [facility] Brooke GASKINS, MD

## 2024-10-28 NOTE — Progress Notes (Signed)
 Symptomatically improve.  BPs have not further trended up since yesterday.  Headache resolved; has not required oxycodone  since yesterday afternoon   Vitals:   10/27/24 1753 10/27/24 1922 10/27/24 2153 10/28/24 0516  BP: (!) 142/82 (!) 144/77 136/88 136/87  Pulse: 85 97 93 (!) 101  Resp: 17 18 19 18   Temp: 98 F (36.7 C) 98 F (36.7 C) 98 F (36.7 C) 98 F (36.7 C)  TempSrc: Oral Oral Oral Oral  SpO2: 100% 99% 95% 99%  Weight:      Height:        NAD Fundus firm Ext: no calf tenderness  Lab Results  Component Value Date   WBC 7.9 10/27/2024   HGB 7.9 (L) 10/27/2024   HCT 23.9 (L) 10/27/2024   MCV 86.6 10/27/2024   PLT 314 10/27/2024    --/--/A POS (11/12 0915)  A/P 38 y.o. [id number] POD#4. Routine postpartum care.   GHTN:  D/c home, no meds, close f/u BP check in 3 days in office  [facility] GEFFEL Lagrange

## 2024-11-05 ENCOUNTER — Telehealth (HOSPITAL_COMMUNITY): Payer: Self-pay | Admitting: *Deleted

## 2024-11-05 NOTE — Telephone Encounter (Signed)
 11/05/2024  Name: Brooke [name] MRN: [id number] DOB: 09-Aug-1986  Reason for Call:  [facility] of Care [facility] Discharge Call  Contact Status: Patient Contact Status: Message  Language assistant needed:          Follow-Up Questions:    Van Postnatal Depression Scale:  In the Past 7 Days:    PHQ2-9 Depression Scale:     Discharge Follow-up:    Post-discharge interventions: NA  Mliss Sieve, RN 11/05/2024 09:37
